# Patient Record
Sex: Female | Born: 1945
Health system: Southern US, Community
[De-identification: ages and names within clinical notes are randomized; demographics above are authoritative.]

## PROBLEM LIST (undated history)

## (undated) DIAGNOSIS — D4620 Refractory anemia with excess of blasts, unspecified: Secondary | ICD-10-CM

## (undated) DIAGNOSIS — Z860100 Personal history of colon polyps, unspecified: Secondary | ICD-10-CM

## (undated) DIAGNOSIS — R000 Tachycardia, unspecified: Secondary | ICD-10-CM

## (undated) DIAGNOSIS — I10 Essential (primary) hypertension: Secondary | ICD-10-CM

## (undated) DIAGNOSIS — T4145XA Adverse effect of unspecified anesthetic, initial encounter: Secondary | ICD-10-CM

## (undated) DIAGNOSIS — T8859XA Other complications of anesthesia, initial encounter: Secondary | ICD-10-CM

## (undated) DIAGNOSIS — K5790 Diverticulosis of intestine, part unspecified, without perforation or abscess without bleeding: Secondary | ICD-10-CM

## (undated) DIAGNOSIS — E785 Hyperlipidemia, unspecified: Secondary | ICD-10-CM

## (undated) DIAGNOSIS — E538 Deficiency of other specified B group vitamins: Secondary | ICD-10-CM

## (undated) HISTORY — PX: OTHER SURGICAL HISTORY: SHX169

## (undated) HISTORY — DX: Diverticulosis of intestine, part unspecified, without perforation or abscess without bleeding: K57.90

## (undated) HISTORY — DX: Tachycardia, unspecified: R00.0

## (undated) HISTORY — DX: Essential (primary) hypertension: I10

## (undated) HISTORY — DX: Hyperlipidemia, unspecified: E78.5

## (undated) HISTORY — DX: Personal history of colonic polyps: Z86.010

## (undated) HISTORY — DX: Deficiency of other specified B group vitamins: E53.8

## (undated) HISTORY — DX: Personal history of colon polyps, unspecified: Z86.0100

---
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
## 1998-04-24 HISTORY — PX: BREAST CYST ASPIRATION: SHX578

## 2004-04-24 LAB — HM DEXA SCAN

## 2009-03-17 LAB — FECAL OCCULT BLOOD, GUAIAC: Fecal Occult Blood: NEGATIVE

## 2009-04-24 LAB — HM PAP SMEAR

## 2009-10-05 LAB — HM MAMMOGRAPHY

## 2010-04-24 HISTORY — PX: OTHER SURGICAL HISTORY: SHX169

## 2010-07-13 ENCOUNTER — Encounter: Payer: Self-pay | Admitting: Family Medicine

## 2010-07-13 DIAGNOSIS — E785 Hyperlipidemia, unspecified: Secondary | ICD-10-CM | POA: Insufficient documentation

## 2010-07-13 DIAGNOSIS — I10 Essential (primary) hypertension: Secondary | ICD-10-CM

## 2010-07-13 DIAGNOSIS — K5790 Diverticulosis of intestine, part unspecified, without perforation or abscess without bleeding: Secondary | ICD-10-CM | POA: Insufficient documentation

## 2010-07-13 DIAGNOSIS — Z86010 Personal history of colonic polyps: Secondary | ICD-10-CM | POA: Insufficient documentation

## 2010-07-13 DIAGNOSIS — E538 Deficiency of other specified B group vitamins: Secondary | ICD-10-CM

## 2010-07-13 DIAGNOSIS — R000 Tachycardia, unspecified: Secondary | ICD-10-CM | POA: Insufficient documentation

## 2010-08-24 ENCOUNTER — Other Ambulatory Visit (HOSPITAL_COMMUNITY): Payer: Self-pay

## 2010-08-24 ENCOUNTER — Other Ambulatory Visit (HOSPITAL_COMMUNITY): Payer: Self-pay | Admitting: General Surgery

## 2010-08-24 DIAGNOSIS — X58XXXA Exposure to other specified factors, initial encounter: Secondary | ICD-10-CM

## 2012-07-30 ENCOUNTER — Other Ambulatory Visit: Payer: Self-pay | Admitting: Family Medicine

## 2012-08-21 ENCOUNTER — Other Ambulatory Visit: Payer: Self-pay | Admitting: Family Medicine

## 2012-09-03 ENCOUNTER — Other Ambulatory Visit: Payer: Self-pay | Admitting: Family Medicine

## 2012-09-04 NOTE — Telephone Encounter (Signed)
LAST OV 9/13 

## 2012-11-13 ENCOUNTER — Encounter: Payer: Self-pay | Admitting: Family Medicine

## 2012-11-13 ENCOUNTER — Telehealth: Payer: Self-pay | Admitting: Family Medicine

## 2012-11-13 ENCOUNTER — Ambulatory Visit (INDEPENDENT_AMBULATORY_CARE_PROVIDER_SITE_OTHER): Payer: BC Managed Care – PPO | Admitting: Family Medicine

## 2012-11-13 VITALS — BP 111/73 | HR 85 | Temp 98.3°F | Ht 66.0 in | Wt 130.2 lb

## 2012-11-13 DIAGNOSIS — R1031 Right lower quadrant pain: Secondary | ICD-10-CM

## 2012-11-13 LAB — POCT CBC
Lymph, poc: 1.7 (ref 0.6–3.4)
MCH, POC: 34.2 pg — AB (ref 27–31.2)
MCHC: 33.9 g/dL (ref 31.8–35.4)
MCV: 101.2 fL — AB (ref 80–97)
MPV: 9 fL (ref 0–99.8)
POC LYMPH PERCENT: 27.2 %L (ref 10–50)
Platelet Count, POC: 263 10*3/uL (ref 142–424)
RBC: 3.8 M/uL — AB (ref 4.04–5.48)
WBC: 6.4 10*3/uL (ref 4.6–10.2)

## 2012-11-13 NOTE — Patient Instructions (Signed)
Avoid any heavy lifting or straining If pain gets worse call back tonight or go to the emergency room We will call you in the morning once we are able to speak to the surgeon

## 2012-11-13 NOTE — Progress Notes (Signed)
  Subjective:    Patient ID: Emily Doyle, female    DOB: Dec 07, 1945, 67 y.o.   MRN: [id number]  HPI Patient was out of town today and noticed that she became flushed and uncomfortable and she felt as feeling in her right lower quadrant. She observed a fairly large bulge at the time. She did come on and since then has noticed that bulging has continued. There is no pain at present.   Review of Systems  Constitutional: Negative for fever.  HENT: Negative.   Respiratory: Negative.   Gastrointestinal: Positive for abdominal pain (lower abd lunch time today 1 episode ), blood in stool and abdominal distention (RLQ, firm bulge ). Negative for nausea, diarrhea and constipation.  Genitourinary: Negative for frequency, hematuria, vaginal bleeding, vaginal discharge and difficulty urinating.  Allergic/Immunologic: Positive for food allergies (MSG).  Neurological: Positive for weakness and light-headedness.       Objective:   Physical Exam  On examination there was a bulging and weakness in the right lower quadrant upon standing. On subsequent exams this bulging was less prominent. When she was supine there definitely is a weakness on the right and the musculature compared to the left. Was no tenderness to palpation.  Results for orders placed in visit on 11/13/12  POCT CBC      Result Value Range   WBC 6.4  4.6 - 10.2 K/uL   Lymph, poc 1.7  0.6 - 3.4   POC LYMPH PERCENT 27.2  10 - 50 %L   POC Granulocyte 4.3  2 - 6.9   Granulocyte percent 67.3  37 - 80 %G   RBC 3.8 (*) 4.04 - 5.48 M/uL   Hemoglobin 13.0  12.2 - 16.2 g/dL   HCT, POC 40.9  81.1 - 47.9 %   MCV 101.2 (*) 80 - 97 fL   MCH, POC 34.2 (*) 27 - 31.2 pg   MCHC 33.9  31.8 - 35.4 g/dL   RDW, POC 91.4     Platelet Count, POC 263.0  142 - 424 K/uL   MPV 9.0  0 - 99.8 fL         Assessment & Plan:  Probable right inguinal hernia -CBC -Discuss with surgeon in the morning  Patient Instructions  Avoid any heavy lifting or  straining If pain gets worse call back tonight or go to the emergency room We will call you in the morning once we are able to speak to the surgeon   Nyra Capes MD

## 2012-11-13 NOTE — Telephone Encounter (Signed)
appt given with dr. Christell Constant at 4:30

## 2012-11-14 ENCOUNTER — Encounter (INDEPENDENT_AMBULATORY_CARE_PROVIDER_SITE_OTHER): Payer: Self-pay | Admitting: Surgery

## 2012-11-14 ENCOUNTER — Other Ambulatory Visit (INDEPENDENT_AMBULATORY_CARE_PROVIDER_SITE_OTHER): Payer: Self-pay

## 2012-11-14 ENCOUNTER — Ambulatory Visit (INDEPENDENT_AMBULATORY_CARE_PROVIDER_SITE_OTHER): Payer: BC Managed Care – PPO | Admitting: Surgery

## 2012-11-14 VITALS — BP 122/80 | HR 80 | Temp 98.6°F | Resp 18 | Ht 67.0 in | Wt 131.0 lb

## 2012-11-14 DIAGNOSIS — K4190 Unilateral femoral hernia, without obstruction or gangrene, not specified as recurrent: Secondary | ICD-10-CM

## 2012-11-14 NOTE — Progress Notes (Signed)
Re:   Emily Doyle DOB:   07/22/1945 MRN:   [id number]  ASSESSMENT AND PLAN: 1.  Right femoral hernia  I discussed the indications and complications of hernia surgery with the patient.  I discussed both the laparoscopic and open approach to hernia repair..  The potential risks of hernia surgery include, but are not limited to, bleeding, infection, open surgery, nerve injury, and recurrence of the hernia.  I provided the patient literature about hernia surgery.  Because she of the location of the tumor, I think she would be better served with an open approach.  Plan:  She has a trip to [place].  I discussed the possibilities of incarceration and bowel obstruction, but I think she is very low risk for this and can wait till after her trip to schedule surgery.  She will call back after the trip to schedule the surgery.  2.  HTN - but thinks that she may not need meds any more 3.  History of tachycardia  Ablation 2011 in [facility]  Has done well since then. 4.  Hypercholesterolemia   Chief Complaint  Patient presents with  . New Evaluation    Hernia   REFERRING PHYSICIAN: Rudi Heap, MD  HISTORY OF PRESENT ILLNESS: Emily Doyle is a 67 y.o. (DOB: [birth year])  white  female whose primary care physician is Rudi Heap, MD and comes to me today for right inguinal hernia. She is by herself.  She has intentionally lost about 20 pounds of weight over the last 4 months.  She has had no prior abdominal problems.  Then yesterday, she noticed a bulge in her right groin..  She had some mild lower abdominal pain and felt "off" during the day.  She lay down and the right groin bulge decreased in size.  She saw Dr. Christell Constant who called me about seeing her.   Question prior abdominal surgery, she talked about exploration  For uterine fibroids in 1990, but nothing was done.  She has no history of stomach disease.  No history of liver disease.  No history of gall bladder disease.  No history of  pancreas disease.  No history of colon disease.  She had a colonoscopy in 2011 in [facility] which was negative.  Past Medical History  Diagnosis Date  . Other and unspecified hyperlipidemia   . HTN (hypertension), benign   . Tachycardia   . Diverticulosis   . History of colon polyps   . Migraine with aura   . Vitamin B 12 deficiency     Past Surgical History  Procedure Laterality Date  . Breast cyst aspiration  2000  . Atrial fibrillation ablation  2012     Current Outpatient Prescriptions  Medication Sig Dispense Refill  . aspirin 81 MG EC tablet Take 81 mg by mouth daily.       . Cholecalciferol (VITAMIN D3) 5000 UNITS TABS Take 5,000 Units by mouth 1 dose over 46 hours.        . cyanocobalamin (,VITAMIN B-12,) 1000 MCG/ML injection INJECT 1 ML (1000 MG) ONCE A MONTH AS DIRECTED  3 mL  2  . lisinopril (PRINIVIL,ZESTRIL) 10 MG tablet TAKE 1 TABLET DAILY  30 tablet  1  . montelukast (SINGULAIR) 10 MG tablet TAKE 1 TABLET DAILY  30 tablet  2  . pravastatin (PRAVACHOL) 40 MG tablet Take 40 mg by mouth daily. 1 & 1/2 daily       . Calcium Carbonate-Vit D-Min (CALCIUM 1200) 1200-1000 MG-UNIT  CHEW Chew 1,200 tablets by mouth 1 dose over 46 hours. Or 600IU twice daily        No current facility-administered medications for this visit.     No Known Allergies  REVIEW OF SYSTEMS: Skin:  No history of rash.  No history of abnormal moles. Infection:  No history of hepatitis or HIV.  No history of MRSA. Neurologic:  No history of stroke.  No history of seizure.  No history of headaches. Cardiac:   History of tachycardia.  She had an ablation in 2011 in [place].  Has done well since then and is not seeing a cardiologist. Pulmonary:  Does not smoke cigarettes.  No asthma or bronchitis.  No OSA/CPAP.  Endocrine:  No diabetes. No thyroid disease.  Hypercholesterolemia. Gastrointestinal:  See HPI. Urologic:  No history of kidney stones.  No history of bladder  infections. Musculoskeletal:  No history of joint or back disease. Hematologic:  No bleeding disorder.  No history of anemia.  Not anticoagulated. Psycho-social:  The patient is oriented.   The patient has no obvious psychologic or social impairment to understanding our conversation and plan.  SOCIAL and FAMILY HISTORY: Married. Going to [place] for 5 days next week.  The trip is with [facility] ([facility]).  PHYSICAL EXAM: BP 122/80  Pulse 80  Temp(Src) 98.6 F (37 C)  Resp 18  Ht 5\' 7"  (1.702 m)  Wt 131 lb (59.421 kg)  BMI 20.51 kg/m2  General: WN thin WF who is alert and generally healthy appearing.  HEENT: Normal. Pupils equal. Neck: Supple. No mass.  No thyroid mass. Lymph Nodes:  No supraclavicular or cervical nodes. Lungs: Clear to auscultation and symmetric breath sounds. Heart:  RRR. No murmur or rub. Abdomen: Soft. No mass. Normal bowel sounds.  She has a 1 x 3 cm mass in her right groin below the inguinal ligament.  This makes me think that she has a femoral hernia.  It is almost completely reduced and non tender. Rectal: Not done. Extremities:  Good strength and ROM  in upper and lower extremities. Neurologic:  Grossly intact to motor and sensory function. Psychiatric: Has normal mood and affect. Behavior is normal.   DATA REVIEWED: Notes in Epic  Ovidio Kin, MD,  [facility], [place] [address]  [address]   [place], [place]    [address] Phone:  [phone] FAX:  [phone]

## 2012-11-15 DIAGNOSIS — K4190 Unilateral femoral hernia, without obstruction or gangrene, not specified as recurrent: Secondary | ICD-10-CM | POA: Insufficient documentation

## 2012-11-21 ENCOUNTER — Other Ambulatory Visit: Payer: Self-pay | Admitting: Family Medicine

## 2012-11-22 ENCOUNTER — Telehealth (INDEPENDENT_AMBULATORY_CARE_PROVIDER_SITE_OTHER): Payer: Self-pay | Admitting: Obstetrics and Gynecology

## 2012-11-22 NOTE — Telephone Encounter (Signed)
Pt wants her surgery date

## 2012-12-05 ENCOUNTER — Other Ambulatory Visit (INDEPENDENT_AMBULATORY_CARE_PROVIDER_SITE_OTHER): Payer: Self-pay | Admitting: Surgery

## 2012-12-05 ENCOUNTER — Telehealth (INDEPENDENT_AMBULATORY_CARE_PROVIDER_SITE_OTHER): Payer: Self-pay | Admitting: *Deleted

## 2012-12-05 NOTE — Telephone Encounter (Signed)
Patient called to ask about getting her surgery scheduled.  Spoke to Dr. Ezzard Standing who went ahead and wrote orders for surgery at which this RN took to [facility] with surgery schedulers.  Florentina Addison states she will call the patient tomorrow to schedule.  Left voice message for patient to update her she will receive a call tomorrow to schedule her surgery but if she has any other questions then to give [place] a call.

## 2013-01-28 ENCOUNTER — Encounter (HOSPITAL_COMMUNITY): Payer: Self-pay | Admitting: Pharmacy Technician

## 2013-01-29 ENCOUNTER — Encounter (HOSPITAL_COMMUNITY): Payer: Self-pay

## 2013-01-29 ENCOUNTER — Encounter (HOSPITAL_COMMUNITY)
Admission: RE | Admit: 2013-01-29 | Discharge: 2013-01-29 | Disposition: A | Discharge status: Home / Self Care | Payer: BC Managed Care – PPO | Source: Ambulatory Visit | Attending: Surgery | Admitting: Surgery

## 2013-01-29 ENCOUNTER — Ambulatory Visit (HOSPITAL_COMMUNITY)
Admission: RE | Admit: 2013-01-29 | Discharge: 2013-01-29 | Disposition: A | Discharge status: Home / Self Care | Payer: BC Managed Care – PPO | Source: Ambulatory Visit | Attending: Surgery | Admitting: Surgery

## 2013-01-29 ENCOUNTER — Other Ambulatory Visit (HOSPITAL_COMMUNITY): Payer: Self-pay | Admitting: Surgery

## 2013-01-29 DIAGNOSIS — K4090 Unilateral inguinal hernia, without obstruction or gangrene, not specified as recurrent: Secondary | ICD-10-CM | POA: Insufficient documentation

## 2013-01-29 DIAGNOSIS — Z01818 Encounter for other preprocedural examination: Secondary | ICD-10-CM | POA: Insufficient documentation

## 2013-01-29 DIAGNOSIS — Z01812 Encounter for preprocedural laboratory examination: Secondary | ICD-10-CM | POA: Insufficient documentation

## 2013-01-29 DIAGNOSIS — Z01810 Encounter for preprocedural cardiovascular examination: Secondary | ICD-10-CM | POA: Insufficient documentation

## 2013-01-29 HISTORY — DX: Adverse effect of unspecified anesthetic, initial encounter: T41.45XA

## 2013-01-29 HISTORY — DX: Other complications of anesthesia, initial encounter: T88.59XA

## 2013-01-29 LAB — CBC
HCT: 35 % — ABNORMAL LOW (ref 36.0–46.0)
Hemoglobin: 12.1 g/dL (ref 12.0–15.0)
MCH: 34.7 pg — ABNORMAL HIGH (ref 26.0–34.0)
MCV: 100.3 fL — ABNORMAL HIGH (ref 78.0–100.0)
Platelets: 305 10*3/uL (ref 150–400)
RDW: 12.9 % (ref 11.5–15.5)

## 2013-01-29 LAB — BASIC METABOLIC PANEL
CO2: 27 mEq/L (ref 19–32)
Calcium: 9.5 mg/dL (ref 8.4–10.5)
Creatinine, Ser: 0.53 mg/dL (ref 0.50–1.10)
GFR calc non Af Amer: >90 mL/min (ref >90)
Glucose, Bld: 90 mg/dL (ref 70–99)
Sodium: 136 mEq/L (ref 135–145)

## 2013-01-29 NOTE — Patient Instructions (Addendum)
20 Emily Doyle  01/29/2013   Your procedure is scheduled on: 02-05-2013  Report to [facility] at 630 AM.  Call this number if you have problems the morning of surgery [phone]   Remember:   Do not eat food or drink liquids :After Midnight.     Take these medicines the morning of surgery with A SIP OF WATER: singulair                                SEE [facility] PREPARING FOR SURGERY SHEET             You may not have any metal on your body including hair pins and piercings  Do not wear jewelry, make-up.  Do not wear lotions, powders, or perfumes. You may wear deodorant.   Men may shave face and neck.  Do not bring valuables to the hospital. [facility] IS NOT RESPONSIBLE FOR VALUEABLES.  Contacts, dentures or bridgework may not be worn into surgery.  Leave suitcase in the car. After surgery it may be brought to your room.  For patients admitted to the hospital, checkout time is 11:00 AM the day of discharge.   Patients discharged the day of surgery will not be allowed to drive home.  Name and phone number of your driver: mack spouse no cell number will be here  Special Instructions: N/A   Please read over the following fact sheets that you were given:   Call Cain Sieve RN pre op nurse if needed [phone]    FAILURE TO FOLLOW THESE INSTRUCTIONS MAY RESULT IN THE CANCELLATION OF YOUR SURGERY.  PATIENT SIGNATURE___________________________________________  NURSE SIGNATURE_____________________________________________

## 2013-02-05 ENCOUNTER — Ambulatory Visit (HOSPITAL_COMMUNITY): Payer: BC Managed Care – PPO | Admitting: Anesthesiology

## 2013-02-05 ENCOUNTER — Ambulatory Visit (HOSPITAL_COMMUNITY)
Admission: RE | Admit: 2013-02-05 | Discharge: 2013-02-05 | Disposition: A | Discharge status: Home / Self Care | Payer: BC Managed Care – PPO | Source: Ambulatory Visit | Attending: Surgery | Admitting: Surgery

## 2013-02-05 ENCOUNTER — Encounter (HOSPITAL_COMMUNITY): Payer: Self-pay | Admitting: *Deleted

## 2013-02-05 ENCOUNTER — Encounter (HOSPITAL_COMMUNITY): Payer: BC Managed Care – PPO | Admitting: Anesthesiology

## 2013-02-05 ENCOUNTER — Encounter (HOSPITAL_COMMUNITY)
Admission: RE | Disposition: A | Discharge status: Home / Self Care | Payer: Self-pay | Source: Ambulatory Visit | Attending: Surgery

## 2013-02-05 DIAGNOSIS — K4190 Unilateral femoral hernia, without obstruction or gangrene, not specified as recurrent: Secondary | ICD-10-CM | POA: Insufficient documentation

## 2013-02-05 DIAGNOSIS — E538 Deficiency of other specified B group vitamins: Secondary | ICD-10-CM | POA: Insufficient documentation

## 2013-02-05 DIAGNOSIS — I10 Essential (primary) hypertension: Secondary | ICD-10-CM | POA: Insufficient documentation

## 2013-02-05 DIAGNOSIS — E7800 Pure hypercholesterolemia, unspecified: Secondary | ICD-10-CM | POA: Insufficient documentation

## 2013-02-05 DIAGNOSIS — Z79899 Other long term (current) drug therapy: Secondary | ICD-10-CM | POA: Insufficient documentation

## 2013-02-05 HISTORY — PX: INGUINAL HERNIA REPAIR: SHX194

## 2013-02-05 SURGERY — REPAIR, HERNIA, INGUINAL, ADULT
Anesthesia: General | Site: Abdomen | Laterality: Right | Wound class: Clean

## 2013-02-05 MED ORDER — CEFAZOLIN SODIUM-DEXTROSE 2-3 GM-% IV SOLR
2.0000 g | INTRAVENOUS | Status: AC
  Administered 2013-02-05: 2 g via INTRAVENOUS

## 2013-02-05 MED ORDER — CEFAZOLIN SODIUM-DEXTROSE 2-3 GM-% IV SOLR
INTRAVENOUS | Status: AC
  Filled 2013-02-05: qty 50

## 2013-02-05 MED ORDER — DEXAMETHASONE SODIUM PHOSPHATE 10 MG/ML IJ SOLN
INTRAMUSCULAR | Status: DC | PRN
  Administered 2013-02-05: 10 mg via INTRAVENOUS

## 2013-02-05 MED ORDER — BUPIVACAINE LIPOSOME 1.3 % IJ SUSP
INTRAMUSCULAR | Status: DC | PRN
  Administered 2013-02-05: 20 mL

## 2013-02-05 MED ORDER — HYDROCODONE-ACETAMINOPHEN 5-325 MG PO TABS: 1.0000 | ORAL_TABLET | Freq: Four times a day (QID) | ORAL | Status: DC | PRN

## 2013-02-05 MED ORDER — MIDAZOLAM HCL 5 MG/5ML IJ SOLN
INTRAMUSCULAR | Status: DC | PRN
  Administered 2013-02-05: 2 mg via INTRAVENOUS

## 2013-02-05 MED ORDER — BUPIVACAINE HCL (PF) 0.25 % IJ SOLN
INTRAMUSCULAR | Status: AC
  Filled 2013-02-05: qty 30

## 2013-02-05 MED ORDER — PROPOFOL 10 MG/ML IV BOLUS
INTRAVENOUS | Status: DC | PRN
  Administered 2013-02-05: 200 mg via INTRAVENOUS

## 2013-02-05 MED ORDER — LIDOCAINE HCL (CARDIAC) 20 MG/ML IV SOLN
INTRAVENOUS | Status: DC | PRN
  Administered 2013-02-05: 100 mg via INTRAVENOUS

## 2013-02-05 MED ORDER — LACTATED RINGERS IV SOLN
INTRAVENOUS | Status: DC | PRN
  Administered 2013-02-05 (×2): via INTRAVENOUS

## 2013-02-05 MED ORDER — FENTANYL CITRATE 0.05 MG/ML IJ SOLN
INTRAMUSCULAR | Status: DC | PRN
  Administered 2013-02-05 (×3): 50 ug via INTRAVENOUS

## 2013-02-05 MED ORDER — KETOROLAC TROMETHAMINE 30 MG/ML IJ SOLN: 15.0000 mg | Freq: Once | INTRAMUSCULAR | Status: DC | PRN

## 2013-02-05 MED ORDER — FENTANYL CITRATE 0.05 MG/ML IJ SOLN: 25.0000 ug | INTRAMUSCULAR | Status: DC | PRN

## 2013-02-05 MED ORDER — PHENYLEPHRINE HCL 10 MG/ML IJ SOLN
INTRAMUSCULAR | Status: DC | PRN
  Administered 2013-02-05: 80 ug via INTRAVENOUS

## 2013-02-05 MED ORDER — BUPIVACAINE LIPOSOME 1.3 % IJ SUSP
20.0000 mL | Freq: Once | INTRAMUSCULAR | Status: DC
  Filled 2013-02-05: qty 20

## 2013-02-05 MED ORDER — PROMETHAZINE HCL 25 MG/ML IJ SOLN: 6.2500 mg | INTRAMUSCULAR | Status: DC | PRN

## 2013-02-05 MED ORDER — ONDANSETRON HCL 4 MG/2ML IJ SOLN
INTRAMUSCULAR | Status: DC | PRN
  Administered 2013-02-05: 4 mg via INTRAMUSCULAR

## 2013-02-05 SURGICAL SUPPLY — 41 items
BENZOIN TINCTURE PRP APPL 2/3 (GAUZE/BANDAGES/DRESSINGS) ×2 IMPLANT
BLADE HEX COATED 2.75 (ELECTRODE) ×2 IMPLANT
BLADE SURG 15 STRL LF DISP TIS (BLADE) IMPLANT
BLADE SURG 15 STRL SS (BLADE)
BLADE SURG SZ10 CARB STEEL (BLADE) ×4 IMPLANT
CANISTER SUCTION 2500CC (MISCELLANEOUS) ×2 IMPLANT
CLOTH BEACON ORANGE TIMEOUT ST (SAFETY) ×2 IMPLANT
DECANTER SPIKE VIAL GLASS SM (MISCELLANEOUS) IMPLANT
DERMABOND ADVANCED (GAUZE/BANDAGES/DRESSINGS) ×1
DERMABOND ADVANCED .7 DNX12 (GAUZE/BANDAGES/DRESSINGS) ×1 IMPLANT
DISSECTOR ROUND CHERRY 3/8 STR (MISCELLANEOUS) ×2 IMPLANT
DRAIN PENROSE 18X1/2 LTX STRL (DRAIN) ×2 IMPLANT
DRAPE LAPAROTOMY TRNSV 102X78 (DRAPE) ×2 IMPLANT
ELECT REM PT RETURN 9FT ADLT (ELECTROSURGICAL) ×2
ELECTRODE REM PT RTRN 9FT ADLT (ELECTROSURGICAL) ×1 IMPLANT
GLOVE BIOGEL PI IND STRL 7.0 (GLOVE) ×1 IMPLANT
GLOVE BIOGEL PI INDICATOR 7.0 (GLOVE) ×1
GLOVE SURG SIGNA 7.5 PF LTX (GLOVE) ×2 IMPLANT
GOWN PREVENTION PLUS LG XLONG (DISPOSABLE) ×2 IMPLANT
GOWN STRL REIN XL XLG (GOWN DISPOSABLE) ×4 IMPLANT
KIT BASIN OR (CUSTOM PROCEDURE TRAY) ×2 IMPLANT
MESH ULTRAPRO 3X6 7.6X15CM (Mesh General) ×2 IMPLANT
NEEDLE HYPO 25X1 1.5 SAFETY (NEEDLE) ×2 IMPLANT
NS IRRIG 1000ML POUR BTL (IV SOLUTION) ×2 IMPLANT
PACK BASIC VI WITH GOWN DISP (CUSTOM PROCEDURE TRAY) ×2 IMPLANT
PENCIL BUTTON HOLSTER BLD 10FT (ELECTRODE) ×2 IMPLANT
SPONGE GAUZE 4X4 12PLY (GAUZE/BANDAGES/DRESSINGS) ×2 IMPLANT
SPONGE LAP 18X18 X RAY DECT (DISPOSABLE) IMPLANT
SPONGE LAP 4X18 X RAY DECT (DISPOSABLE) ×4 IMPLANT
STRIP CLOSURE SKIN 1/2X4 (GAUZE/BANDAGES/DRESSINGS) ×2 IMPLANT
SUT CHROMIC 0 SH (SUTURE) IMPLANT
SUT MON AB 5-0 PS2 18 (SUTURE) ×2 IMPLANT
SUT NOVA 0 T19/GS 22DT (SUTURE) ×6 IMPLANT
SUT VIC AB 0 CT2 27 (SUTURE) ×2 IMPLANT
SUT VIC AB 2-0 SH 27 (SUTURE) ×1
SUT VIC AB 2-0 SH 27X BRD (SUTURE) ×1 IMPLANT
SUT VIC AB 3-0 SH 18 (SUTURE) ×2 IMPLANT
SYR BULB IRRIGATION 50ML (SYRINGE) ×2 IMPLANT
SYR CONTROL 10ML LL (SYRINGE) ×2 IMPLANT
TOWEL OR 17X26 10 PK STRL BLUE (TOWEL DISPOSABLE) ×2 IMPLANT
YANKAUER SUCT BULB TIP 10FT TU (MISCELLANEOUS) ×2 IMPLANT

## 2013-02-05 NOTE — Op Note (Signed)
02/05/2013  10:23 AM  PATIENT:  Emily Doyle, 67 y.o., female, MRN: [id number]  PREOP DIAGNOSIS:  right femorl hernia  POSTOP DIAGNOSIS:   Right femoral hernia  PROCEDURE:   Procedure(s): Open HERNIA REPAIR femoral ADULT  SURGEON:   Ovidio Kin, M.D.  ANESTHESIA:   general  Anesthesiologist: Eilene Ghazi, MD CRNA: Doran Clay, CRNA  General  EBL:  minimal  ml  LOCAL MEDICATIONS USED:   20 cc Exparel  SPECIMEN:   None  COUNTS CORRECT:  YES  INDICATIONS FOR PROCEDURE:  [name] is a 67 y.o. (DOB: May 04, 1945) white  female whose primary care physician is Rudi Heap, MD and comes for repair of a right femoral hernia.   The indications and risks of the hernia surgery were explained to the patient.  The risks include, but are not limited to, infection, bleeding, recurrence of the hernia, and nerve injury.  Operative Note: The patient was taken to room number 6 at [facility] OR.  He underwent a general anesthesia.    A time out was held and the surgical checklist run.  Her lower abdomen was shaved and then prepped with chloroprep.  A right inguinal incision was made through the subcutaneous fat to the external oblique fascia.  The external ring was opened.  The inguinal floor had some laxity, but the primary hernia was a femoral hernia I identified below the inguinal ligament..  The hernia was freed up from the surrounding tissues and pushed above the inguinal ligament.  The hernia consisted primarily of preperitoneal fat.  The inguinal floor and femoral canal was repaired with a 3 x 6 inch piece of Ultrapro mesh.  The mesh was cut to fit the inguinal floor.  The mesh was sewn in place with interrupted 0 Novafil suture.  I sewed the mesh medially to the pubic tubercle.  Inferiorly to Cooper's ligament, then transitioned to the inguinal ligament.  Cranially the mesh was sewn to the transversalis muscle.  The mesh lay flat.  The inguinal floor and right femoral canal was  covered.  The external oblique was closed with a 3-0 vicryl.  The fascia and subcutaneous tissues were infiltrated with 20 cc of Exparel.  The skin was closed with 5-0 monocryl and painted with Dermabond. The sponge and needle count were correct at the end of the case.  The patient was transported to the recovery room in good condition.  The patient will go home today.  Discharge instructions reviewed.  Ovidio Kin, MD, [facility] Pager: [phone] Office phone:  [phone]

## 2013-02-05 NOTE — Transfer of Care (Signed)
Immediate Anesthesia Transfer of Care Note  Patient: Emily Doyle  Procedure(s) Performed: Procedure(s): HERNIA REPAIR femoral ADULT (Right)  Patient Location: PACU  Anesthesia Type:General  Level of Consciousness: sedated  Airway & Oxygen Therapy: Patient Spontanous Breathing and Patient connected to face mask oxygen  Post-op Assessment: Report given to PACU RN and Post -op Vital signs reviewed and stable  Post vital signs: Reviewed and stable  Complications: No apparent anesthesia complications

## 2013-02-05 NOTE — Anesthesia Postprocedure Evaluation (Signed)
  Anesthesia Post-op Note  Patient: Emily Doyle  Procedure(s) Performed: Procedure(s) (LRB): HERNIA REPAIR femoral ADULT (Right)  Patient Location: PACU  Anesthesia Type: General  Level of Consciousness: awake and alert   Airway and Oxygen Therapy: Patient Spontanous Breathing  Post-op Pain: mild  Post-op Assessment: Post-op Vital signs reviewed, Patient's Cardiovascular Status Stable, Respiratory Function Stable, Patent Airway and No signs of Nausea or vomiting  Last Vitals:  Filed Vitals:   02/05/13 1118  BP: 110/73  Pulse: 72  Temp: 36.6 C  Resp:     Post-op Vital Signs: stable   Complications: No apparent anesthesia complications

## 2013-02-05 NOTE — Progress Notes (Signed)
Pt ambulated to bathroom without complications.  Pt voided moderated amount of clear yellow urine.  Pt returned to Short Stay with LR from PACU.    Pt received LR while in short stay.

## 2013-02-05 NOTE — H&P (Signed)
Re: Emily Doyle  DOB: [birth year]  MRN: [id number]   ASSESSMENT AND PLAN:  1. Right femoral hernia   I discussed the indications and complications of hernia surgery with the patient. I discussed both the laparoscopic and open approach to hernia repair.. The potential risks of hernia surgery include, but are not limited to, bleeding, infection, open surgery, nerve injury, and recurrence of the hernia. I provided the patient literature about hernia surgery.   Because she of the location of the hernia, I think she would be better served with an open approach.   Plan: She had a good trip to [place].  To repair right femoral/inguinal hernia.  2. HTN - but thinks that she may not need meds any more  3. History of tachycardia  Ablation 2011 in [facility] Has done well since then.  4. Hypercholesterolemia   Chief Complaint   Patient presents with   .  New Evaluation     Hernia   REFERRING PHYSICIAN: Rudi Heap, MD   HISTORY OF PRESENT ILLNESS:  Emily Doyle is a 66 y.o. (DOB: 1945-11-19) white female whose primary care physician is Rudi Heap, MD and comes to me today for right inguinal hernia. She is by herself.  She has intentionally lost about 20 pounds of weight over the last 4 months. She has had no prior abdominal problems. Then yesterday, she noticed a bulge in her right groin.. She had some mild lower abdominal pain and felt "off" during the day. She lay down and the right groin bulge decreased in size. She saw Dr. Christell Constant who called me about seeing her.  Question prior abdominal surgery, she talked about exploration For uterine fibroids in 1990, but nothing was done. She has no history of stomach disease. No history of liver disease. No history of gall bladder disease. No history of pancreas disease. No history of colon disease. She had a colonoscopy in 2011 in [facility] which was negative.   Past Medical History   Diagnosis  Date   .  Other and unspecified hyperlipidemia     .  HTN (hypertension), benign    .  Tachycardia    .  Diverticulosis    .  History of colon polyps    .  Migraine with aura    .  Vitamin B 12 deficiency     Past Surgical History   Procedure  Laterality  Date   .  Breast cyst aspiration   2000   .  Atrial fibrillation ablation   2012    Current Outpatient Prescriptions   Medication  Sig  Dispense  Refill   .  aspirin 81 MG EC tablet  Take 81 mg by mouth daily.     .  Cholecalciferol (VITAMIN D3) 5000 UNITS TABS  Take 5,000 Units by mouth 1 dose over 46 hours.     .  cyanocobalamin (,VITAMIN B-12,) 1000 MCG/ML injection  INJECT 1 ML (1000 MG) ONCE A MONTH AS DIRECTED  3 mL  2   .  lisinopril (PRINIVIL,ZESTRIL) 10 MG tablet  TAKE 1 TABLET DAILY  30 tablet  1   .  montelukast (SINGULAIR) 10 MG tablet  TAKE 1 TABLET DAILY  30 tablet  2   .  pravastatin (PRAVACHOL) 40 MG tablet  Take 40 mg by mouth daily. 1 & 1/2 daily     .  Calcium Carbonate-Vit D-Min (CALCIUM 1200) 1200-1000 MG-UNIT CHEW  Chew 1,200 tablets by mouth 1 dose over 46 hours.  Or 600IU twice daily      No current facility-administered medications for this visit.   No Known Allergies   REVIEW OF SYSTEMS:  Skin: No history of rash. No history of abnormal moles.  Infection: No history of hepatitis or HIV. No history of MRSA.  Neurologic: No history of stroke. No history of seizure. No history of headaches.  Cardiac: History of tachycardia. She had an ablation in 2011 in [place]. Has done well since then and is not seeing a cardiologist.  Pulmonary: Does not smoke cigarettes. No asthma or bronchitis. No OSA/CPAP.  Endocrine: No diabetes. No thyroid disease. Hypercholesterolemia.  Gastrointestinal: See HPI.  Urologic: No history of kidney stones. No history of bladder infections.  Musculoskeletal: No history of joint or back disease.  Hematologic: No bleeding disorder. No history of anemia. Not anticoagulated.  Psycho-social: The patient is oriented. The patient has no  obvious psychologic or social impairment to understanding our conversation and plan.  SOCIAL and FAMILY HISTORY:  Married.  Going to [place] for 5 days next week. The trip is with [facility] ([facility]).   PHYSICAL EXAM:  BP 130/83  Pulse 74  Temp(Src) 97.6 F (36.4 C) (Oral)  Resp 18  SpO2 100%  Ht 5\' 7"  (1.702 m)  Wt 131 lb (59.421 kg)  BMI 20.51 kg/m2   General: WN thin WF who is alert and generally healthy appearing.  HEENT: Normal. Pupils equal.  Neck: Supple. No mass. No thyroid mass.  Lymph Nodes: No supraclavicular or cervical nodes.  Lungs: Clear to auscultation and symmetric breath sounds.  Heart: RRR. No murmur or rub.  Abdomen: Soft. No mass. Normal bowel sounds. She has a 1 x 3 cm mass in her right groin below the inguinal ligament. This makes me think that she has a femoral hernia. It is almost completely reduced and non tender.  Rectal: Not done.  Extremities: Good strength and ROM in upper and lower extremities.  Neurologic: Grossly intact to motor and sensory function.  Psychiatric: Has normal mood and affect. Behavior is normal.   DATA REVIEWED:  Notes in Epic   Ovidio Kin, MD, [facility], [place]  [address] [place]  [place], [place] [address]  Phone: [phone] FAX: [phone]

## 2013-02-05 NOTE — Anesthesia Preprocedure Evaluation (Signed)
Anesthesia Evaluation  Patient identified by MRN, date of birth, ID band Patient awake    Reviewed: Allergy & Precautions, H&P , NPO status , Patient's Chart, lab work & pertinent test results  Airway Mallampati: II TM Distance: >3 FB Neck ROM: Full    Dental no notable dental hx.    Pulmonary neg pulmonary ROS,  breath sounds clear to auscultation  Pulmonary exam normal       Cardiovascular hypertension, Pt. on medications Rhythm:Regular Rate:Normal     Neuro/Psych negative neurological ROS  negative psych ROS   GI/Hepatic negative GI ROS, Neg liver ROS,   Endo/Other  negative endocrine ROS  Renal/GU negative Renal ROS  negative genitourinary   Musculoskeletal negative musculoskeletal ROS (+)   Abdominal   Peds negative pediatric ROS (+)  Hematology negative hematology ROS (+)   Anesthesia Other Findings   Reproductive/Obstetrics negative OB ROS                           Anesthesia Physical Anesthesia Plan  ASA: II  Anesthesia Plan: General   Post-op Pain Management:    Induction: Intravenous  Airway Management Planned: Oral ETT and LMA  Additional Equipment:   Intra-op Plan:   Post-operative Plan: Extubation in OR  Informed Consent: I have reviewed the patients History and Physical, chart, labs and discussed the procedure including the risks, benefits and alternatives for the proposed anesthesia with the patient or authorized representative who has indicated his/her understanding and acceptance.   Dental advisory given  Plan Discussed with: CRNA and Surgeon  Anesthesia Plan Comments:         Anesthesia Quick Evaluation

## 2013-02-06 ENCOUNTER — Encounter (HOSPITAL_COMMUNITY): Payer: Self-pay | Admitting: Surgery

## 2013-02-20 ENCOUNTER — Ambulatory Visit (INDEPENDENT_AMBULATORY_CARE_PROVIDER_SITE_OTHER): Payer: BC Managed Care – PPO | Admitting: Surgery

## 2013-02-20 ENCOUNTER — Encounter (INDEPENDENT_AMBULATORY_CARE_PROVIDER_SITE_OTHER): Payer: Self-pay | Admitting: Surgery

## 2013-02-20 VITALS — BP 126/74 | HR 62 | Temp 98.6°F | Resp 16 | Ht 67.0 in | Wt 132.0 lb

## 2013-02-20 DIAGNOSIS — K4190 Unilateral femoral hernia, without obstruction or gangrene, not specified as recurrent: Secondary | ICD-10-CM

## 2013-02-20 NOTE — Progress Notes (Signed)
Re:   Emily Doyle DOB:   Jun 30, 1945 MRN:   [id number]  ASSESSMENT AND PLAN: 1.  Right femoral hernia  Repaired - 02/05/2013 - D. Aleya Durnell  Has done well.    Her return appt is PRN. 2.  HTN - but thinks that she may not need meds any more 3.  History of tachycardia  Ablation 2011 in [facility].  Has done well since then. 4.  Hypercholesterolemia   Chief Complaint  Patient presents with  . Follow-up   REFERRING PHYSICIAN: Rudi Heap, MD  HISTORY OF PRESENT ILLNESS: MARCIE Doyle is a 67 y.o. (DOB: Jul 03, 1945)  white  female whose primary care physician is Rudi Heap, MD and comes to me today for follow up of a right femoral hernia repair. She is by herself. The Exparel worked well for her, in that she took one pain med. She has otherwise done very well.  Past Medical History  Diagnosis Date  . Other and unspecified hyperlipidemia   . HTN (hypertension), benign   . Tachycardia   . Diverticulosis   . History of colon polyps   . Migraine with aura   . Vitamin B 12 deficiency   . Complication of anesthesia     slow to wake up     Current Outpatient Prescriptions  Medication Sig Dispense Refill  . aspirin 81 MG EC tablet Take 81 mg by mouth daily.       . Cholecalciferol (VITAMIN D3) 5000 UNITS TABS Take 5,000 Units by mouth daily.       . Cyanocobalamin (VITAMIN B-12 IJ) Inject as directed every 30 (thirty) days. Around the 1st of the month      . diphenhydrAMINE (BENADRYL) 25 MG tablet Take 25 mg by mouth every 6 (six) hours as needed for itching. Takes 1/2 to 1 tab prn hs      . HYDROcodone-acetaminophen (NORCO/VICODIN) 5-325 MG per tablet Take 1-2 tablets by mouth every 6 (six) hours as needed for pain.  30 tablet  0  . Ibuprofen-Diphenhydramine Cit (ADVIL PM PO) Take 1 tablet by mouth at bedtime.      Marland Kitchen lisinopril (PRINIVIL,ZESTRIL) 10 MG tablet Take 10 mg by mouth every morning.      . montelukast (SINGULAIR) 10 MG tablet Take 10 mg by mouth every morning.       . Multiple Vitamin (MULTIVITAMIN WITH MINERALS) TABS tablet Take 1 tablet by mouth daily.      . pravastatin (PRAVACHOL) 40 MG tablet Take 40 mg by mouth every evening.       No current facility-administered medications for this visit.      Allergies  Allergen Reactions  . Other Other (See Comments)    Msg= cramps and diarrhea    REVIEW OF SYSTEMS: Cardiac:   History of tachycardia.  She had an ablation in 2011 in [place].  Has done well since then and is not seeing a cardiologist. Endocrine:  Hypercholesterolemia. Gastrointestinal:  See HPI.  SOCIAL and FAMILY HISTORY: Married. Going to [place] for 5 days next week.  The trip is with [facility] ([facility]. [place] [facility]).  PHYSICAL EXAM: BP 126/74  Pulse 62  Temp(Src) 98.6 F (37 C) (Temporal)  Resp 16  Ht 5\' 7"  (1.702 m)  Wt 132 lb (59.875 kg)  BMI 20.67 kg/m2  General: WN thin WF who is alert and generally healthy appearing.  Abdomen: Right groin incision - okay.  DATA REVIEWED: Notes in Epic  Ovidio Kin, MD,  [facility], [place]  [place], [place]    [place] Phone:  [place]:  [phone]

## 2013-03-06 ENCOUNTER — Encounter (HOSPITAL_COMMUNITY): Payer: Self-pay | Admitting: Surgery

## 2013-03-08 ENCOUNTER — Other Ambulatory Visit: Payer: Self-pay | Admitting: Family Medicine

## 2013-03-11 NOTE — Telephone Encounter (Signed)
Patient needs to come in for liver function tests and cholesterol panel The prescription will be okay but she does need to come in and get this lab work

## 2013-03-11 NOTE — Telephone Encounter (Signed)
No OV or labs since 09/13

## 2013-06-23 ENCOUNTER — Other Ambulatory Visit: Payer: Self-pay | Admitting: Family Medicine

## 2013-06-24 ENCOUNTER — Other Ambulatory Visit: Payer: Self-pay | Admitting: Family Medicine

## 2013-06-25 NOTE — Telephone Encounter (Signed)
Last seen 11/13/12  DWM   No lipids since EPIC

## 2013-06-26 NOTE — Telephone Encounter (Signed)
Last seen 11/14/12  DWM

## 2013-08-14 ENCOUNTER — Other Ambulatory Visit: Payer: Self-pay | Admitting: Family Medicine

## 2013-08-14 NOTE — Telephone Encounter (Signed)
Last seen 11/13/12  DWM

## 2013-09-10 ENCOUNTER — Ambulatory Visit (INDEPENDENT_AMBULATORY_CARE_PROVIDER_SITE_OTHER): Payer: BC Managed Care – PPO | Admitting: Physician Assistant

## 2013-09-10 ENCOUNTER — Encounter: Payer: Self-pay | Admitting: Physician Assistant

## 2013-09-10 VITALS — BP 117/73 | HR 82 | Temp 98.0°F | Ht 67.0 in | Wt 137.0 lb

## 2013-09-10 DIAGNOSIS — J019 Acute sinusitis, unspecified: Secondary | ICD-10-CM

## 2013-09-10 MED ORDER — AMOXICILLIN-POT CLAVULANATE 875-125 MG PO TABS: 1.0000 | ORAL_TABLET | Freq: Two times a day (BID) | ORAL | Status: DC

## 2013-09-10 NOTE — Patient Instructions (Signed)

## 2013-09-10 NOTE — Progress Notes (Signed)
Subjective:     Patient ID: Emily Doyle, female   DOB: August 08, 1945, 68 y.o.   MRN: [id number]  HPI Pt with dry hacky cough for 1 weeks Has used OTC cold  meds but continues with sx  Review of Systems  HENT: Positive for congestion, postnasal drip, rhinorrhea, sinus pressure, sore throat and voice change. Negative for sneezing.   Respiratory: Positive for cough. Negative for apnea, chest tightness and wheezing.        Objective:   Physical Exam Ears- canals/TM's nl Oral- + PND, no increase in tonsil size No cerv nodes Heart- RRR w/o M Lungs- CTA Sinus- + TTP frontal    Assessment:     Sinusitis    Plan:     OTC anithist Fluids Rest Augmentin rx 1 po bid # 20 F/U prn

## 2013-10-01 ENCOUNTER — Other Ambulatory Visit: Payer: Self-pay | Admitting: Family Medicine

## 2013-10-02 NOTE — Telephone Encounter (Signed)
Last seen 09/10/13 WLW  No lipids since EPIC

## 2014-01-05 ENCOUNTER — Other Ambulatory Visit: Payer: Self-pay | Admitting: Family Medicine

## 2014-01-06 NOTE — Telephone Encounter (Signed)
No B12 labs in Epic. No injections documented in Epic. Last refill 06/18/13. ntbs

## 2014-01-13 ENCOUNTER — Other Ambulatory Visit: Payer: Self-pay | Admitting: Family Medicine

## 2014-01-14 NOTE — Telephone Encounter (Signed)
No labs or visit since 01/15/12

## 2014-01-14 NOTE — Telephone Encounter (Signed)
I will okay the refill. Please call and tell the patient she needs to come in and get some routine lab work to monitor her cholesterol and her liver function tests.

## 2014-01-15 ENCOUNTER — Telehealth: Payer: Self-pay

## 2014-02-11 ENCOUNTER — Ambulatory Visit (INDEPENDENT_AMBULATORY_CARE_PROVIDER_SITE_OTHER): Payer: BC Managed Care – PPO | Admitting: Family Medicine

## 2014-02-11 ENCOUNTER — Encounter: Payer: Self-pay | Admitting: Family Medicine

## 2014-02-11 ENCOUNTER — Other Ambulatory Visit: Payer: Self-pay | Admitting: Family Medicine

## 2014-02-11 VITALS — BP 124/78 | HR 74 | Temp 97.6°F | Ht 67.0 in | Wt 130.0 lb

## 2014-02-11 DIAGNOSIS — E785 Hyperlipidemia, unspecified: Secondary | ICD-10-CM

## 2014-02-11 DIAGNOSIS — Z13820 Encounter for screening for osteoporosis: Secondary | ICD-10-CM

## 2014-02-11 DIAGNOSIS — Z780 Asymptomatic menopausal state: Secondary | ICD-10-CM

## 2014-02-11 DIAGNOSIS — Z825 Family history of asthma and other chronic lower respiratory diseases: Secondary | ICD-10-CM | POA: Insufficient documentation

## 2014-02-11 DIAGNOSIS — J301 Allergic rhinitis due to pollen: Secondary | ICD-10-CM

## 2014-02-11 DIAGNOSIS — E538 Deficiency of other specified B group vitamins: Secondary | ICD-10-CM

## 2014-02-11 DIAGNOSIS — I10 Essential (primary) hypertension: Secondary | ICD-10-CM

## 2014-02-11 DIAGNOSIS — Z800 Family history of malignant neoplasm of digestive organs: Secondary | ICD-10-CM | POA: Insufficient documentation

## 2014-02-11 DIAGNOSIS — Z23 Encounter for immunization: Secondary | ICD-10-CM

## 2014-02-11 DIAGNOSIS — Z0000 Encounter for general adult medical examination without abnormal findings: Secondary | ICD-10-CM

## 2014-02-11 LAB — POCT URINALYSIS DIPSTICK
Bilirubin, UA: NEGATIVE
Glucose, UA: NEGATIVE
Nitrite, UA: NEGATIVE
PH UA: 6
PROTEIN UA: NEGATIVE
SPEC GRAV UA: 1.015
UROBILINOGEN UA: NEGATIVE

## 2014-02-11 LAB — POCT UA - MICROSCOPIC ONLY
CASTS, UR, LPF, POC: NEGATIVE
CRYSTALS, UR, HPF, POC: NEGATIVE
Mucus, UA: NEGATIVE
YEAST UA: NEGATIVE

## 2014-02-11 LAB — POCT CBC
GRANULOCYTE PERCENT: 53.3 % (ref 37–80)
HEMATOCRIT: 33.8 % — AB (ref 37.7–47.9)
HEMOGLOBIN: 11.2 g/dL — AB (ref 12.2–16.2)
Lymph, poc: 1.5 (ref 0.6–3.4)
MCH, POC: 34.7 pg — AB (ref 27–31.2)
MCHC: 33.2 g/dL (ref 31.8–35.4)
MCV: 104.4 fL — AB (ref 80–97)
MPV: 9.3 fL (ref 0–99.8)
POC LYMPH PERCENT: 40.3 %L (ref 10–50)
Platelet Count, POC: 256 10*3/uL (ref 142–424)
RBC: 3.2 M/uL — AB (ref 4.04–5.48)
RDW, POC: 13.8 %
WBC: 3.8 10*3/uL — AB (ref 4.6–10.2)

## 2014-02-11 MED ORDER — LISINOPRIL 10 MG PO TABS: ORAL_TABLET | ORAL | Status: DC

## 2014-02-11 MED ORDER — CYANOCOBALAMIN 1000 MCG/ML IJ SOLN: INTRAMUSCULAR | Status: DC

## 2014-02-11 MED ORDER — PRAVASTATIN SODIUM 40 MG PO TABS: ORAL_TABLET | ORAL | Status: DC

## 2014-02-11 MED ORDER — MONTELUKAST SODIUM 10 MG PO TABS: ORAL_TABLET | ORAL | Status: DC

## 2014-02-11 NOTE — Addendum Note (Signed)
Addended by: Zannie Cove on: 02/11/2014 03:27 PM   Modules accepted: Orders

## 2014-02-11 NOTE — Patient Instructions (Addendum)
Continue current medications. Continue good therapeutic lifestyle changes which include good diet and exercise. Fall precautions discussed with patient. If an FOBT was given today- please return it to our front desk. If you are over 68 years old - you may need Prevnar 76 or the adult Pneumonia vaccine.  Flu Shots will be available at our office starting mid- September. Please call and schedule a FLU CLINIC APPOINTMENT.   Be sure and get shingles shot, Prevnar vaccine, and flu shot. Stay away from artificial sweeteners, Stevia and truvia should be safe Please signup for My Chart Continue to drink plenty of fluids Please check with the gastroenterologist as you may need a colonoscopy next spring

## 2014-02-11 NOTE — Progress Notes (Signed)
Subjective:    Patient ID: Emily Doyle, female    DOB: [birth year], 68 y.o.   MRN: [id number]  HPI Patient is here today for annual wellness exam and follow up of chronic medical problems.With health maintenance issues, she is due to get a Prevnar vaccine and a flu shot. The patient is also due to get a shingles shot. She will get the flu shot at work. She has no specific complaints today. She is no longer having any palpitation and no longer takes Lanoxin.        Patient Active Problem List   Diagnosis Date Noted  . Family history of colon cancer 02/11/2014  . Femoral hernia, right. 11/15/2012  . Vitamin B 12 deficiency 07/13/2010  . Hyperlipidemia   . HTN (hypertension), benign   . Tachycardia   . Diverticulosis   . Colon polyps    Outpatient Encounter Prescriptions as of 02/11/2014  Medication Sig  . aspirin 81 MG EC tablet Take 81 mg by mouth daily.   . Cholecalciferol (VITAMIN D3) 5000 UNITS TABS Take 5,000 Units by mouth daily.   . cyanocobalamin (,VITAMIN B-12,) 1000 MCG/ML injection INJECT 1 ML (1000MG) ONCE A MONTH AS DIRECTED  . Ibuprofen-Diphenhydramine Cit (ADVIL PM PO) Take 1 tablet by mouth at bedtime.  Marland Kitchen lisinopril (PRINIVIL,ZESTRIL) 10 MG tablet TAKE 1 TABLET DAILY  . montelukast (SINGULAIR) 10 MG tablet TAKE 1 TABLET DAILY  . Multiple Vitamin (MULTIVITAMIN WITH MINERALS) TABS tablet Take 1 tablet by mouth daily.  . pravastatin (PRAVACHOL) 40 MG tablet TAKE 1 & 1/2 TABLET AT BEDTIME  . [DISCONTINUED] amoxicillin-clavulanate (AUGMENTIN) 875-125 MG per tablet Take 1 tablet by mouth 2 (two) times daily.    Review of Systems  Constitutional: Negative.   HENT: Negative.   Eyes: Negative.   Respiratory: Negative.   Cardiovascular: Negative.   Gastrointestinal: Negative.   Endocrine: Negative.   Genitourinary: Negative.   Musculoskeletal: Negative.   Skin: Negative.   Allergic/Immunologic: Negative.   Neurological: Negative.   Hematological: Negative.    Psychiatric/Behavioral: Negative.        Objective:   Physical Exam  Nursing note and vitals reviewed. Constitutional: She is oriented to person, place, and time. She appears well-developed and well-nourished. No distress.  HENT:  Head: Normocephalic and atraumatic.  Right Ear: External ear normal.  Left Ear: External ear normal.  Mouth/Throat: Oropharynx is clear and moist.  Nasal congestion and turbinate swelling bilaterally  Eyes: Conjunctivae and EOM are normal. Pupils are equal, round, and reactive to light. Right eye exhibits no discharge. Left eye exhibits no discharge. No scleral icterus.  Neck: Normal range of motion. Neck supple. No thyromegaly present.  No carotid bruits  Cardiovascular: Normal rate, regular rhythm, normal heart sounds and intact distal pulses.  Exam reveals no gallop and no friction rub.   No murmur heard. At 72 per minute without murmur  Pulmonary/Chest: Effort normal and breath sounds normal. No respiratory distress. She has no wheezes. She has no rales. She exhibits no tenderness.  No axillary adenopathy  Abdominal: Soft. Bowel sounds are normal. She exhibits no mass. There is no tenderness. There is no rebound and no guarding.  No inguinal adenopathy  Musculoskeletal: Normal range of motion. She exhibits no edema and no tenderness.  Lymphadenopathy:    She has no cervical adenopathy.  Neurological: She is alert and oriented to person, place, and time. She has normal reflexes. No cranial nerve deficit.  Skin: Skin is warm and dry. No rash  noted. No erythema. No pallor.  Dry skin  Psychiatric: She has a normal mood and affect. Her behavior is normal. Judgment and thought content normal.   BP 124/78  Pulse 74  Temp(Src) 97.6 F (36.4 C) (Oral)  Ht _0  (1.702 m)  Wt 130 lb (58.968 kg)  BMI 20.36 kg/m2  EKG:  normal sinus rhythm       Assessment & Plan:   1. Vitamin B 12 deficiency - POCT CBC - Vitamin B12  2. Hyperlipidemia - POCT  CBC - NMR, lipoprofile - Hepatitis C Antibody  3. HTN (hypertension), benign - POCT CBC - BMP8+EGFR - Hepatic function panel  4. Annual physical exam - DG Bone Density; Future - POCT CBC - BMP8+EGFR - Hepatic function panel - NMR, lipoprofile - Thyroid Panel With TSH - Vit D  25 hydroxy (rtn osteoporosis monitoring) - Vitamin B12 - Urine culture - POCT urinalysis dipstick - POCT UA - Microscopic Only - Hepatitis C Antibody - High sensitivity CRP - Sedimentation rate - EKG 12-Lead  5. Postmenopausal - DG Bone Density; Future - POCT CBC  6. Screening for osteoporosis - DG Bone Density; Future - POCT CBC  7. Allergic rhinitis due to pollen  8. Family history of COPD (chronic obstructive pulmonary disease)   Meds ordered this encounter  Medications  . pravastatin (PRAVACHOL) 40 MG tablet    Sig: TAKE 1 & 1/2 TABLET AT BEDTIME    Dispense:  135 tablet    Refill:  11  . lisinopril (PRINIVIL,ZESTRIL) 10 MG tablet    Sig: TAKE 1 TABLET DAILY    Dispense:  30 tablet    Refill:  11  . cyanocobalamin (,VITAMIN B-12,) 1000 MCG/ML injection    Sig: INJECT 1 ML (1000MG) ONCE A MONTH AS DIRECTED    Dispense:  3 mL    Refill:  3    ntbs for further refills  . montelukast (SINGULAIR) 10 MG tablet    Sig: TAKE 1 TABLET DAILY    Dispense:  30 tablet    Refill:  11   Patient Instructions  Continue current medications. Continue good therapeutic lifestyle changes which include good diet and exercise. Fall precautions discussed with patient. If an FOBT was given today- please return it to our front desk. If you are over 29 years old - you may need Prevnar 24 or the adult Pneumonia vaccine.  Flu Shots will be available at our office starting mid- September. Please call and schedule a FLU CLINIC APPOINTMENT.   Be sure and get shingles shot, Prevnar vaccine, and flu shot. Stay away from artificial sweeteners, Stevia and truvia should be safe Please signup for My  Chart Continue to drink plenty of fluids Please check with the gastroenterologist as you may need a colonoscopy next spring   Arrie Senate MD

## 2014-02-12 ENCOUNTER — Other Ambulatory Visit: Payer: BC Managed Care – PPO

## 2014-02-12 DIAGNOSIS — Z1212 Encounter for screening for malignant neoplasm of rectum: Secondary | ICD-10-CM

## 2014-02-12 LAB — SEDIMENTATION RATE: Sed Rate: 3 mm/hr (ref 0–40)

## 2014-02-12 NOTE — Addendum Note (Signed)
Addended by: Wyline Mood on: 02/12/2014 05:25 PM   Modules accepted: Orders

## 2014-02-13 LAB — URINE CULTURE

## 2014-02-14 LAB — FECAL OCCULT BLOOD, IMMUNOCHEMICAL: Fecal Occult Bld: NEGATIVE

## 2014-02-16 LAB — THYROID PANEL WITH TSH
FREE THYROXINE INDEX: 2.6 (ref 1.2–4.9)
T3 Uptake Ratio: 34 % (ref 24–39)
T4, Total: 7.5 ug/dL (ref 4.5–12.0)
TSH: 0.659 u[IU]/mL (ref 0.450–4.500)

## 2014-02-16 LAB — HEPATIC FUNCTION PANEL
ALT: 13 IU/L (ref 0–32)
AST: 20 IU/L (ref 0–40)
Albumin: 4.7 g/dL (ref 3.6–4.8)
Alkaline Phosphatase: 76 IU/L (ref 39–117)
Bilirubin, Direct: 0.14 mg/dL (ref 0.00–0.40)
Total Bilirubin: 0.5 mg/dL (ref 0.0–1.2)
Total Protein: 6.9 g/dL (ref 6.0–8.5)

## 2014-02-16 LAB — NMR, LIPOPROFILE
Cholesterol: 198 mg/dL (ref 100–199)
HDL Cholesterol by NMR: 117 mg/dL (ref >39)
HDL Particle Number: 47.3 umol/L (ref >=30.5)
LDL Particle Number: 781 nmol/L (ref <1000)
LDL Size: 21.2 nm (ref >20.5)
LDLC SERPL CALC-MCNC: 67 mg/dL (ref 0–99)
Small LDL Particle Number: <90 nmol/L (ref <=527)
Triglycerides by NMR: 70 mg/dL (ref 0–149)

## 2014-02-16 LAB — BMP8+EGFR
BUN/Creatinine Ratio: 24 (ref 11–26)
BUN: 14 mg/dL (ref 8–27)
CHLORIDE: 100 mmol/L (ref 97–108)
CO2: 22 mmol/L (ref 18–29)
Calcium: 9.4 mg/dL (ref 8.7–10.3)
Creatinine, Ser: 0.58 mg/dL (ref 0.57–1.00)
GFR calc Af Amer: 110 mL/min/{1.73_m2} (ref >59)
GFR, EST NON AFRICAN AMERICAN: 95 mL/min/{1.73_m2} (ref >59)
GLUCOSE: 90 mg/dL (ref 65–99)
Potassium: 3.9 mmol/L (ref 3.5–5.2)
Sodium: 140 mmol/L (ref 134–144)

## 2014-02-16 LAB — HEPATITIS C ANTIBODY: Hep C Virus Ab: <0.1 s/co ratio (ref 0.0–0.9)

## 2014-02-16 LAB — HIGH SENSITIVITY CRP: CRP, High Sensitivity: 0.48 mg/L (ref 0.00–3.00)

## 2014-02-16 LAB — VITAMIN B12: Vitamin B-12: 241 pg/mL (ref 211–946)

## 2014-02-16 LAB — VITAMIN D 25 HYDROXY (VIT D DEFICIENCY, FRACTURES): VIT D 25 HYDROXY: 67.9 ng/mL (ref 30.0–100.0)

## 2014-02-17 ENCOUNTER — Telehealth: Payer: Self-pay | Admitting: Family Medicine

## 2014-02-17 ENCOUNTER — Telehealth: Payer: Self-pay | Admitting: *Deleted

## 2014-02-17 NOTE — Telephone Encounter (Signed)
Patient aware.

## 2014-02-17 NOTE — Telephone Encounter (Signed)
Aware. 

## 2014-03-10 ENCOUNTER — Telehealth: Payer: Self-pay | Admitting: *Deleted

## 2014-03-10 NOTE — Telephone Encounter (Signed)
Patient aware of normal Alpha-1 Antitrypsin result.

## 2014-03-12 ENCOUNTER — Telehealth: Payer: Self-pay | Admitting: Family Medicine

## 2014-03-13 MED ORDER — CYANOCOBALAMIN 1000 MCG/ML IJ SOLN: INTRAMUSCULAR | Status: DC

## 2014-03-13 NOTE — Telephone Encounter (Signed)
Order for vitamin b12 sent to pharmacy and order for nurse at [facility] sent to Ananias Pilgrim for her to administer Vitamin b12 injections once weekly for a month and then to resume monthly.

## 2014-04-09 NOTE — Telephone Encounter (Signed)
Close encounter 

## 2014-11-13 ENCOUNTER — Other Ambulatory Visit (INDEPENDENT_AMBULATORY_CARE_PROVIDER_SITE_OTHER): Payer: BLUE CROSS/BLUE SHIELD

## 2014-11-13 ENCOUNTER — Other Ambulatory Visit: Payer: Self-pay | Admitting: Family Medicine

## 2014-11-13 DIAGNOSIS — T17908A Unspecified foreign body in respiratory tract, part unspecified causing other injury, initial encounter: Secondary | ICD-10-CM

## 2014-12-07 ENCOUNTER — Encounter: Payer: Self-pay | Admitting: Family Medicine

## 2014-12-07 ENCOUNTER — Ambulatory Visit (INDEPENDENT_AMBULATORY_CARE_PROVIDER_SITE_OTHER): Payer: BLUE CROSS/BLUE SHIELD | Admitting: Family Medicine

## 2014-12-07 ENCOUNTER — Telehealth: Payer: Self-pay | Admitting: Family Medicine

## 2014-12-07 VITALS — BP 133/87 | HR 75 | Temp 98.2°F | Ht 67.0 in | Wt 132.6 lb

## 2014-12-07 DIAGNOSIS — N390 Urinary tract infection, site not specified: Secondary | ICD-10-CM

## 2014-12-07 LAB — POCT UA - MICROSCOPIC ONLY
BACTERIA, U MICROSCOPIC: NEGATIVE
CRYSTALS, UR, HPF, POC: NEGATIVE
Casts, Ur, LPF, POC: NEGATIVE
RBC, URINE, MICROSCOPIC: NEGATIVE
Yeast, UA: NEGATIVE

## 2014-12-07 LAB — POCT URINALYSIS DIPSTICK
BILIRUBIN UA: NEGATIVE
Blood, UA: NEGATIVE
GLUCOSE UA: NEGATIVE
KETONES UA: NEGATIVE
Leukocytes, UA: NEGATIVE
Nitrite, UA: NEGATIVE
Protein, UA: NEGATIVE
SPEC GRAV UA: 1.02
Urobilinogen, UA: NEGATIVE
pH, UA: 7

## 2014-12-07 MED ORDER — CEPHALEXIN 500 MG PO CAPS: 500.0000 mg | ORAL_CAPSULE | Freq: Three times a day (TID) | ORAL | Status: DC

## 2014-12-07 NOTE — Progress Notes (Signed)
Patient ID: Emily Doyle, female   DOB: Jun 05, 1945, 69 y.o.   MRN: [id number]   HPI  Patient presents today for same-day evaluation of malaise and fatigue.  Patient explains that about 3 weeks ago she had a colonoscopy. That same day after she went home she developed fever and malaise. Following day she had a chest x-ray which was clear.  She then went to the mouth was on a trip and continued to feel poorly. She called the GI clinic where she had the colonoscopy who advised her to go to the ER. She went to the ER at [facility] and had a wpork up that revealed UTI with UA with 2+ leuks and 3-5 WBCs While at the ED she had another chest x-ray which was clear and a CT abdomen which ruled out perforation, appendicitis, bowel traction, and diverticulitis. She did have a very tiny kidney stone which was not obstructing urinary tract. She was given Macrobid for the UTI and felt better for 3-4 days after she completed the course. Then her symptoms returned.  She has not had overt fever since that time. She states that she has had generalized malaise, fatigue, and just feels poor. She has a "almost headache" and just doesn't feel like herself.  She denies any new medications. She denies diarrhea, dyspnea, chest pain, cough, and palpitations. She has a history of SVT with correction after ablation.  PMH: Smoking status noted ROS: Per HPI  Objective: BP 133/87 mmHg  Pulse 75  Temp(Src) 98.2 F (36.8 C) (Oral)  Ht 5\' 7"  (1.702 m)  Wt 132 lb 9.6 oz (60.147 kg)  BMI 20.76 kg/m2 Gen: NAD, alert, cooperative with exam HEENT: NCAT CV: RRR, good S1/S2, no murmur Resp: CTABL, no wheezes, non-labored Abd: SNTND, BS present, no guarding or organomegaly Ext: No edema, warm Neuro: Alert and oriented, No gross deficits  Assessment and plan:  UTI (urinary tract infection) No evidence of UTI today but had return of symptoms after completing course of macrobid recently Send  urine for culture With nonspecific illness after Cscope considered Perf and other serious etiology but abd exam is benign and she had a normal CT scan at a recent ED visit.    Orders Placed This Encounter  Procedures  . POCT UA - Microscopic Only  . POCT urinalysis dipstick    Meds ordered this encounter  Medications  . Probiotic Product (PROBIOTIC DAILY PO)    Sig: Take by mouth.  . cephALEXin (KEFLEX) 500 MG capsule    Sig: Take 1 capsule (500 mg total) by mouth 3 (three) times daily.    Dispense:  21 capsule    Refill:  Branch, MD [facility] 12/07/2014, 4:47 PM

## 2014-12-07 NOTE — Telephone Encounter (Signed)
Appointment made with Dr. Wendi Snipes for possible UTI.  Patient aware of appointment time

## 2014-12-07 NOTE — Assessment & Plan Note (Signed)
No evidence of UTI today but had return of symptoms after completing course of macrobid recently Send urine for culture With nonspecific illness after Cscope considered Perf and other serious etiology but abd exam is benign and she had a normal CT scan at a recent ED visit.

## 2014-12-09 ENCOUNTER — Telehealth: Payer: Self-pay | Admitting: Family Medicine

## 2014-12-09 LAB — URINE CULTURE

## 2014-12-09 NOTE — Telephone Encounter (Signed)
I called and discussed her urine Culture results. Escherichia coli sensitive to first generation cyclosporins like Keflex but intermediate to Macrobid which she had previously.  She feels better, encouraged to continue and finish anti-biotics.  Follow-up as needed.   Laroy Apple, MD [facility] 12/09/2014, 5:25 PM

## 2015-02-08 ENCOUNTER — Ambulatory Visit (INDEPENDENT_AMBULATORY_CARE_PROVIDER_SITE_OTHER): Payer: BLUE CROSS/BLUE SHIELD | Admitting: Family Medicine

## 2015-02-08 ENCOUNTER — Encounter: Payer: Self-pay | Admitting: Family Medicine

## 2015-02-08 VITALS — BP 123/84 | HR 78 | Temp 98.8°F | Wt 135.4 lb

## 2015-02-08 DIAGNOSIS — N390 Urinary tract infection, site not specified: Secondary | ICD-10-CM

## 2015-02-08 DIAGNOSIS — M5431 Sciatica, right side: Secondary | ICD-10-CM | POA: Diagnosis not present

## 2015-02-08 LAB — POCT URINALYSIS DIPSTICK
BILIRUBIN UA: NEGATIVE
Blood, UA: NEGATIVE
Glucose, UA: NEGATIVE
KETONES UA: NEGATIVE
NITRITE UA: NEGATIVE
PROTEIN UA: NEGATIVE
Spec Grav, UA: 1.01
Urobilinogen, UA: NEGATIVE
pH, UA: 6

## 2015-02-08 LAB — POCT UA - MICROSCOPIC ONLY
Bacteria, U Microscopic: NEGATIVE
Casts, Ur, LPF, POC: NEGATIVE
Crystals, Ur, HPF, POC: NEGATIVE
MUCUS UA: NEGATIVE
RBC, URINE, MICROSCOPIC: NEGATIVE
Yeast, UA: NEGATIVE

## 2015-02-08 MED ORDER — HYDROCODONE-ACETAMINOPHEN 5-325 MG PO TABS: 1.0000 | ORAL_TABLET | Freq: Four times a day (QID) | ORAL | Status: DC | PRN

## 2015-02-08 MED ORDER — ZOLPIDEM TARTRATE 5 MG PO TABS: 5.0000 mg | ORAL_TABLET | Freq: Every evening | ORAL | Status: DC | PRN

## 2015-02-08 MED ORDER — PREDNISONE 20 MG PO TABS: ORAL_TABLET | ORAL | Status: DC

## 2015-02-08 NOTE — Patient Instructions (Signed)
Great to see you!  Try the hydrocodone at night when you are ready to stay home, it is a narcotic so use caution.   If this doesn't improve within 4-6 weeks we should do x rays, please come back f that's the case.   Sciatica Sciatica is pain, weakness, numbness, or tingling along the path of the sciatic nerve. The nerve starts in the lower back and runs down the back of each leg. The nerve controls the muscles in the lower leg and in the back of the knee, while also providing sensation to the back of the thigh, lower leg, and the sole of your foot. Sciatica is a symptom of another medical condition. For instance, nerve damage or certain conditions, such as a herniated disk or bone spur on the spine, pinch or put pressure on the sciatic nerve. This causes the pain, weakness, or other sensations normally associated with sciatica. Generally, sciatica only affects one side of the body. CAUSES   Herniated or slipped disc.  Degenerative disk disease.  A pain disorder involving the narrow muscle in the buttocks (piriformis syndrome).  Pelvic injury or fracture.  Pregnancy.  Tumor (rare). SYMPTOMS  Symptoms can vary from mild to very severe. The symptoms usually travel from the low back to the buttocks and down the back of the leg. Symptoms can include:  Mild tingling or dull aches in the lower back, leg, or hip.  Numbness in the back of the calf or sole of the foot.  Burning sensations in the lower back, leg, or hip.  Sharp pains in the lower back, leg, or hip.  Leg weakness.  Severe back pain inhibiting movement. These symptoms may get worse with coughing, sneezing, laughing, or prolonged sitting or standing. Also, being overweight may worsen symptoms. DIAGNOSIS  Your caregiver will perform a physical exam to look for common symptoms of sciatica. He or she may ask you to do certain movements or activities that would trigger sciatic nerve pain. Other tests may be performed to find the  cause of the sciatica. These may include:  Blood tests.  X-rays.  Imaging tests, such as an MRI or CT scan. TREATMENT  Treatment is directed at the cause of the sciatic pain. Sometimes, treatment is not necessary and the pain and discomfort goes away on its own. If treatment is needed, your caregiver may suggest:  Over-the-counter medicines to relieve pain.  Prescription medicines, such as anti-inflammatory medicine, muscle relaxants, or narcotics.  Applying heat or ice to the painful area.  Steroid injections to lessen pain, irritation, and inflammation around the nerve.  Reducing activity during periods of pain.  Exercising and stretching to strengthen your abdomen and improve flexibility of your spine. Your caregiver may suggest losing weight if the extra weight makes the back pain worse.  Physical therapy.  Surgery to eliminate what is pressing or pinching the nerve, such as a bone spur or part of a herniated disk. HOME CARE INSTRUCTIONS   Only take over-the-counter or prescription medicines for pain or discomfort as directed by your caregiver.  Apply ice to the affected area for 20 minutes, 3-4 times a day for the first 48-72 hours. Then try heat in the same way.  Exercise, stretch, or perform your usual activities if these do not aggravate your pain.  Attend physical therapy sessions as directed by your caregiver.  Keep all follow-up appointments as directed by your caregiver.  Do not wear high heels or shoes that do not provide proper support.  Check your mattress to see if it is too soft. A firm mattress may lessen your pain and discomfort. SEEK IMMEDIATE MEDICAL CARE IF:   You lose control of your bowel or bladder (incontinence).  You have increasing weakness in the lower back, pelvis, buttocks, or legs.  You have redness or swelling of your back.  You have a burning sensation when you urinate.  You have pain that gets worse when you lie down or awakens you  at night.  Your pain is worse than you have experienced in the past.  Your pain is lasting longer than 4 weeks.  You are suddenly losing weight without reason. MAKE SURE YOU:  Understand these instructions.  Will watch your condition.  Will get help right away if you are not doing well or get worse.   This information is not intended to replace advice given to you by your health care provider. Make sure you discuss any questions you have with your health care provider.   Document Released: 04/04/2001 Document Revised: 12/30/2014 Document Reviewed: 08/20/2011 Elsevier Interactive Patient Education [employer].

## 2015-02-08 NOTE — Progress Notes (Signed)
   HPI  Patient presents today here for complaints of low back pain. She describes a intermittently sharp right-sided back pain with dull continuous pain. It's radiating down her right lateral hip right medial hip, and down the posterior part of her leg. Single and on for 3 weeks with no inciting trauma. She denies any hematuria, urinary frequency, dysuria, fevers, chills, sweats.  She's had this problem once before the self resolved after 3-4 weeks.  She is concerned she has a trip to [place] planned in a week and would like to be sure that she does not have pain as she is walking around  She also read request 6 Ambien to help her with jet lag and sleeping on the plane.    PMH: Smoking status noted ROS: Per HPI  Objective: BP 123/84 mmHg  Pulse 78  Temp(Src) 98.8 F (37.1 C) (Oral)  Wt 135 lb 6.4 oz (61.417 kg) Gen: NAD, alert, cooperative with exam HEENT: NCAT Ext: No edema, warm Neuro: Alert and oriented, No gross deficits Musculoskeletal: No lumbar spine or paraspinal muscle tenderness, negative Fabere and logroll except for increased pain in the right lower back, full range of motion of the right hip   Assessment and plan:  # Sciatica With groin pain and was initially worried about hip etiology, however her hip specific exams all cause low back pain rather than groin pain, Prednisone Norco for relief of pain prior to prednisone, given caution as this is a narcotic   # Jet lag Given Ambien as requested  Previous UTI Trace leukocyte esterase, 10-15 WBCs per high-power field Sent for culture   Orders Placed This Encounter  Procedures  . POCT UA - Microscopic Only  . POCT urinalysis dipstick    Meds ordered this encounter  Medications  . predniSONE (DELTASONE) 20 MG tablet    Sig: Take 2 tabs daily for 4 days, then take 1 tab daily for 4 days, then take one half tab daily 4 days then stop.    Dispense:  14 tablet    Refill:  0  . HYDROcodone-acetaminophen  (NORCO) 5-325 MG tablet    Sig: Take 1 tablet by mouth every 6 (six) hours as needed for moderate pain.    Dispense:  30 tablet    Refill:  0  . zolpidem (AMBIEN) 5 MG tablet    Sig: Take 1 tablet (5 mg total) by mouth at bedtime as needed for sleep.    Dispense:  6 tablet    Refill:  0    Laroy Apple, MD [facility] 02/08/2015, 2:38 PM

## 2015-02-09 ENCOUNTER — Telehealth: Payer: Self-pay

## 2015-02-09 NOTE — Telephone Encounter (Signed)
Called and discussed, no symptoms of illness.   Mild HA and Tmax 99.2  Started prednisone today Recommended close f/u if symptoms of illness develop including dyspnea,. Cough, dysuria or other concerning symptoms. I have also sent urine for a culture given trace leuks but she is largely asymptomatic.   Reassurance, low threshold for RTC as they are leaving the country this weekend.   Laroy Apple, MD [facility] 02/09/2015, 3:59 PM

## 2015-02-10 LAB — URINE CULTURE: Organism ID, Bacteria: NO GROWTH

## 2015-02-16 ENCOUNTER — Ambulatory Visit: Payer: BC Managed Care – PPO | Admitting: Family Medicine

## 2015-02-25 ENCOUNTER — Encounter: Payer: Self-pay | Admitting: Family Medicine

## 2015-02-25 ENCOUNTER — Ambulatory Visit (INDEPENDENT_AMBULATORY_CARE_PROVIDER_SITE_OTHER): Payer: BLUE CROSS/BLUE SHIELD | Admitting: Family Medicine

## 2015-02-25 VITALS — BP 122/77 | HR 81 | Temp 98.1°F | Ht 67.0 in | Wt 135.8 lb

## 2015-02-25 DIAGNOSIS — M5431 Sciatica, right side: Secondary | ICD-10-CM | POA: Diagnosis not present

## 2015-02-25 NOTE — Progress Notes (Signed)
   HPI  Patient presents today  Here follow-up of rigt-sidedscatica a hp pain.  She had a trip to [place] and did pretty well throughout her trip. Sh did have contniued pain but it did not limit her function  she returned from her tripon Monday, 3days ago, since that time she's imprved with only using iuprofen.   She brought me a book today "healing back pain, the mind body connection"   PMH: Smoking status noted ROS: Per HPI  Objective: BP 122/77 mmHg  Pulse 81  Temp(Src) 98.1 F (36.7 C) (Oral)  Ht 5\' 7"  (1.702 m)  Wt 135 lb 12.8 oz (61.598 kg)  BMI 21.26 kg/m2 Gen: NAD, alert, cooperative with exam HEENT: NCAT Neuro: Alert and oriented, No gross deficits  Assessment and plan:  # right-sided sciatica Improving She would like to continue conservative treatment Consider sports medicine referral to [facility] if  continued.     Laroy Apple, MD [facility] 02/25/2015, 4:34 PM

## 2015-02-26 ENCOUNTER — Other Ambulatory Visit: Payer: Self-pay | Admitting: Family Medicine

## 2015-03-03 ENCOUNTER — Encounter: Payer: Self-pay | Admitting: Family Medicine

## 2015-03-03 ENCOUNTER — Ambulatory Visit (INDEPENDENT_AMBULATORY_CARE_PROVIDER_SITE_OTHER): Payer: BLUE CROSS/BLUE SHIELD | Admitting: Family Medicine

## 2015-03-03 VITALS — BP 136/84 | HR 81 | Temp 97.7°F | Ht 67.0 in | Wt 133.0 lb

## 2015-03-03 DIAGNOSIS — I10 Essential (primary) hypertension: Secondary | ICD-10-CM

## 2015-03-03 DIAGNOSIS — G4700 Insomnia, unspecified: Secondary | ICD-10-CM

## 2015-03-03 DIAGNOSIS — Z0000 Encounter for general adult medical examination without abnormal findings: Secondary | ICD-10-CM

## 2015-03-03 DIAGNOSIS — E538 Deficiency of other specified B group vitamins: Secondary | ICD-10-CM

## 2015-03-03 DIAGNOSIS — Z780 Asymptomatic menopausal state: Secondary | ICD-10-CM

## 2015-03-03 DIAGNOSIS — E785 Hyperlipidemia, unspecified: Secondary | ICD-10-CM

## 2015-03-03 DIAGNOSIS — R7989 Other specified abnormal findings of blood chemistry: Secondary | ICD-10-CM

## 2015-03-03 DIAGNOSIS — J301 Allergic rhinitis due to pollen: Secondary | ICD-10-CM

## 2015-03-03 MED ORDER — LISINOPRIL 10 MG PO TABS: 10.0000 mg | ORAL_TABLET | Freq: Every day | ORAL | Status: DC

## 2015-03-03 MED ORDER — MONTELUKAST SODIUM 10 MG PO TABS: 10.0000 mg | ORAL_TABLET | Freq: Every day | ORAL | Status: DC

## 2015-03-03 NOTE — Patient Instructions (Addendum)
Medicare Annual Wellness Visit  [facility] and the medical providers at [facility] strive to bring you the best medical care.  In doing so we not only want to address your current medical conditions and concerns but also to detect new conditions early and prevent illness, disease and health-related problems.    Medicare offers a yearly Wellness Visit which allows our clinical staff to assess your need for preventative services including immunizations, lifestyle education, counseling to decrease risk of preventable diseases and screening for fall risk and other medical concerns.    This visit is provided free of charge (no copay) for all Medicare recipients. The clinical pharmacists at [facility] have begun to conduct these Wellness Visits which will also include a thorough review of all your medications.    As you primary medical provider recommend that you make an appointment for your Annual Wellness Visit if you have not done so already this year.  You may set up this appointment before you leave today or you may call back ([phone]) and schedule an appointment.  Please make sure when you call that you mention that you are scheduling your Annual Wellness Visit with the clinical pharmacist so that the appointment may be made for the proper length of time.     Continue current medications. Continue good therapeutic lifestyle changes which include good diet and exercise. Fall precautions discussed with patient. If an FOBT was given today- please return it to our front desk. If you are over 54 years old - you may need Prevnar 70 or the adult Pneumonia vaccine.  **Flu shots are available--- please call and schedule a FLU-CLINIC appointment**  After your visit with [place] today you will receive a survey in the mail or online from [employer] regarding your care with [place]. Please take a moment to fill this out. Your feedback is very  important to [place] as you can help [place] better understand your patient needs as well as improve your experience and satisfaction. WE CARE ABOUT YOU!!!   The patient should continue to take her current medication. She should avoid irritating environments to help protect her from allergy issues. She should follow-up with her dermatologist as planned in January She needs to make sure that she gets her pelvic exam and mammogram and DEXA scan done that we get a copy of this report We will call when the lab work becomes available and she should take a copy of this with her when she goes to get her pelvic exam She should get her flu shot at her husband's work place

## 2015-03-03 NOTE — Progress Notes (Signed)
Subjective:    Patient ID: URI COVEY, female    DOB: 04/06/1946, 69 y.o.   MRN: [id number]  HPI Patient is here today for annual wellness exam and follow up of chronic medical problems which includes hypertension and hyperlipidemia. She is taking medications regularly. The patient has no specific complaints. She continues to have sciatica. She has a history of allergic rhinitis hyperlipidemia hypertension and occasional bouts of tachycardia. She also has a history of colon polyps. Her last colonoscopy was actually the summer. Her mammogram is due are either the last one was in September 2015. She is going to get her flu shot this coming week. She is requesting refills on her lisinopril and Singulair. The patient denies chest pain shortness of breath trouble swallowing and heartburn indigestion nausea vomiting diarrhea or blood in the stool. She says when she has her blood pressures checked outside the office they're using in the 120s over the 70s. Her sister had uterine cancer and she is particular by getting her mammograms pelvic sounds and Pap smears done regularly and this is coming up soon for her. She will also get her DEXA scan. She will make sure that she gets a copy of that information to [place] after those tests are done. The family history is positive for Alzheimer's in her mother and hydrocephalus and her father.     Patient Active Problem List   Diagnosis Date Noted  . Sciatica of right side 02/08/2015  . UTI (urinary tract infection) 12/07/2014  . Family history of COPD (chronic obstructive pulmonary disease) 02/11/2014  . Allergic rhinitis due to pollen 02/11/2014  . Vitamin B 12 deficiency 07/13/2010  . Hyperlipidemia   . HTN (hypertension), benign   . Tachycardia   . Diverticulosis   . Colon polyps    Outpatient Encounter Prescriptions as of 03/03/2015  Medication Sig  . aspirin 81 MG EC tablet Take 81 mg by mouth daily.   . Cholecalciferol (VITAMIN D3) 5000 UNITS TABS  Take 5,000 Units by mouth daily.   . cyanocobalamin (,VITAMIN B-12,) 1000 MCG/ML injection INJECT 1 ML (1000MG) weekly for 4 weeks and then resume monthly  . HYDROcodone-acetaminophen (NORCO) 5-325 MG tablet Take 1 tablet by mouth every 6 (six) hours as needed for moderate pain.  . Ibuprofen-Diphenhydramine Cit (ADVIL PM PO) Take 1 tablet by mouth at bedtime.  Marland Kitchen lisinopril (PRINIVIL,ZESTRIL) 10 MG tablet TAKE 1 TABLET DAILY  . montelukast (SINGULAIR) 10 MG tablet TAKE 1 TABLET DAILY  . Multiple Vitamin (MULTIVITAMIN WITH MINERALS) TABS tablet Take 1 tablet by mouth daily.  . pravastatin (PRAVACHOL) 40 MG tablet TAKE 1 & 1/2 TABLET AT BEDTIME  . Probiotic Product (PROBIOTIC DAILY PO) Take by mouth.  . zolpidem (AMBIEN) 5 MG tablet Take 1 tablet (5 mg total) by mouth at bedtime as needed for sleep.   No facility-administered encounter medications on file as of 03/03/2015.      Review of Systems  Constitutional: Negative.   HENT: Negative.   Eyes: Negative.   Respiratory: Negative.   Cardiovascular: Negative.   Gastrointestinal: Negative.   Endocrine: Negative.   Genitourinary: Negative.   Musculoskeletal: Negative.   Skin: Negative.   Allergic/Immunologic: Negative.   Neurological: Negative.   Hematological: Negative.   Psychiatric/Behavioral: Negative.        Objective:   Physical Exam  Constitutional: She is oriented to person, place, and time. She appears well-developed and well-nourished. No distress.  HENT:  Head: Normocephalic and atraumatic.  Right Ear: External  ear normal.  Left Ear: External ear normal.  Mouth/Throat: Oropharynx is clear and moist.  Nasal congestion and turbinate swelling right greater than left the patient is having no symptoms as she is taking her Singulair.  Eyes: Conjunctivae and EOM are normal. Pupils are equal, round, and reactive to light. Right eye exhibits no discharge. Left eye exhibits no discharge. No scleral icterus.  Neck: Normal range  of motion. Neck supple. No thyromegaly present.  Neck without bruits or thyromegaly  Cardiovascular: Normal rate, regular rhythm, normal heart sounds and intact distal pulses.   No murmur heard. The heart had a regular rate and rhythm at 72/m  Pulmonary/Chest: Effort normal and breath sounds normal. No respiratory distress. She has no wheezes. She has no rales. She exhibits no tenderness.  Clear anteriorly and posteriorly  Abdominal: Soft. Bowel sounds are normal. She exhibits no mass. There is no tenderness. There is no rebound and no guarding.  Nontender without masses or organ enlargement bruits  Musculoskeletal: Normal range of motion. She exhibits no edema.  Lymphadenopathy:    She has no cervical adenopathy.  Neurological: She is alert and oriented to person, place, and time. She has normal reflexes. No cranial nerve deficit.  Skin: Skin is warm and dry. No rash noted.  Psychiatric: She has a normal mood and affect. Her behavior is normal. Judgment and thought content normal.  Nursing note and vitals reviewed.  BP 136/84 mmHg  Pulse 81  Temp(Src) 97.7 F (36.5 C) (Oral)  Ht '5\' 7"'  (1.702 m)  Wt 133 lb (60.328 kg)  BMI 20.83 kg/m2        Assessment & Plan:  1. Hyperlipidemia -Continue pravastatin pending results of lab work - CBC with Differential/Platelet - NMR, lipoprofile  2. HTN (hypertension), benign -Continue lisinopril as blood pressure is good today. - BMP8+EGFR - CBC with Differential/Platelet - Hepatic function panel  3. Annual physical exam -The patient has plans to get her flu shot mammogram and DEXA scan pelvic exam and Pap smear. - BMP8+EGFR - CBC with Differential/Platelet - Hepatic function panel - NMR, lipoprofile - Vit D  25 hydroxy (rtn osteoporosis monitoring) - Thyroid Panel With TSH  4. Vitamin B 12 deficiency -We will check her B12 level at this visit - CBC with Differential/Platelet - Vitamin B12  5. Postmenopausal -She is not having  any vasomotor spasms and she plans to get her pelvic exam soon with her gynecologist - CBC with Differential/Platelet  6. Insomnia -Continue Ambien as needed  7. Allergic rhinitis due to pollen -Continue Singulair and this winter use nasal saline each nostril frequently and keep the house as cool as possible  Meds ordered this encounter  Medications  . lisinopril (PRINIVIL,ZESTRIL) 10 MG tablet    Sig: Take 1 tablet (10 mg total) by mouth daily.    Dispense:  90 tablet    Refill:  3  . montelukast (SINGULAIR) 10 MG tablet    Sig: Take 1 tablet (10 mg total) by mouth daily.    Dispense:  90 tablet    Refill:  3   Patient Instructions                       Medicare Annual Wellness Visit  [facility] and the medical providers at [facility] strive to bring you the best medical care.  In doing so we not only want to address your current medical conditions and concerns but also to detect new conditions  early and prevent illness, disease and health-related problems.    Medicare offers a yearly Wellness Visit which allows our clinical staff to assess your need for preventative services including immunizations, lifestyle education, counseling to decrease risk of preventable diseases and screening for fall risk and other medical concerns.    This visit is provided free of charge (no copay) for all Medicare recipients. The clinical pharmacists at [facility] have begun to conduct these Wellness Visits which will also include a thorough review of all your medications.    As you primary medical provider recommend that you make an appointment for your Annual Wellness Visit if you have not done so already this year.  You may set up this appointment before you leave today or you may call back ([phone]) and schedule an appointment.  Please make sure when you call that you mention that you are scheduling your Annual Wellness Visit with the clinical  pharmacist so that the appointment may be made for the proper length of time.     Continue current medications. Continue good therapeutic lifestyle changes which include good diet and exercise. Fall precautions discussed with patient. If an FOBT was given today- please return it to our front desk. If you are over 40 years old - you may need Prevnar 72 or the adult Pneumonia vaccine.  **Flu shots are available--- please call and schedule a FLU-CLINIC appointment**  After your visit with [place] today you will receive a survey in the mail or online from [employer] regarding your care with [place]. Please take a moment to fill this out. Your feedback is very important to [place] as you can help [place] better understand your patient needs as well as improve your experience and satisfaction. WE CARE ABOUT YOU!!!   The patient should continue to take her current medication. She should avoid irritating environments to help protect her from allergy issues. She should follow-up with her dermatologist as planned in January She needs to make sure that she gets her pelvic exam and mammogram and DEXA scan done that we get a copy of this report We will call when the lab work becomes available and she should take a copy of this with her when she goes to get her pelvic exam She should get her flu shot at her husband's work place   Arrie Senate MD

## 2015-03-04 LAB — CBC WITH DIFFERENTIAL/PLATELET
BASOS ABS: 0 10*3/uL (ref 0.0–0.2)
BASOS: 1 %
EOS (ABSOLUTE): 0.1 10*3/uL (ref 0.0–0.4)
Eos: 2 %
Hematocrit: 33.1 % — ABNORMAL LOW (ref 34.0–46.6)
Hemoglobin: 11 g/dL — ABNORMAL LOW (ref 11.1–15.9)
IMMATURE GRANS (ABS): 0 10*3/uL (ref 0.0–0.1)
Immature Granulocytes: 0 %
LYMPHS: 32 %
Lymphocytes Absolute: 1.2 10*3/uL (ref 0.7–3.1)
MCH: 35.5 pg — ABNORMAL HIGH (ref 26.6–33.0)
MCHC: 33.2 g/dL (ref 31.5–35.7)
MCV: 107 fL — ABNORMAL HIGH (ref 79–97)
Monocytes Absolute: 0.3 10*3/uL (ref 0.1–0.9)
Monocytes: 9 %
NEUTROS PCT: 56 %
Neutrophils Absolute: 2.2 10*3/uL (ref 1.4–7.0)
PLATELETS: 351 10*3/uL (ref 150–379)
RBC: 3.1 x10E6/uL — AB (ref 3.77–5.28)
RDW: 15.8 % — AB (ref 12.3–15.4)
WBC: 3.9 10*3/uL (ref 3.4–10.8)

## 2015-03-04 LAB — BMP8+EGFR
BUN/Creatinine Ratio: 28 — ABNORMAL HIGH (ref 11–26)
BUN: 14 mg/dL (ref 8–27)
CALCIUM: 9.2 mg/dL (ref 8.7–10.3)
CHLORIDE: 105 mmol/L (ref 97–106)
CO2: 24 mmol/L (ref 18–29)
Creatinine, Ser: 0.5 mg/dL — ABNORMAL LOW (ref 0.57–1.00)
GFR calc Af Amer: 114 mL/min/{1.73_m2} (ref >59)
GFR calc non Af Amer: 99 mL/min/{1.73_m2} (ref >59)
GLUCOSE: 83 mg/dL (ref 65–99)
POTASSIUM: 4.9 mmol/L (ref 3.5–5.2)
Sodium: 144 mmol/L (ref 136–144)

## 2015-03-04 LAB — HEPATIC FUNCTION PANEL
ALT: 18 IU/L (ref 0–32)
AST: 21 IU/L (ref 0–40)
Albumin: 4.4 g/dL (ref 3.6–4.8)
Alkaline Phosphatase: 68 IU/L (ref 39–117)
BILIRUBIN TOTAL: 0.3 mg/dL (ref 0.0–1.2)
Bilirubin, Direct: 0.1 mg/dL (ref 0.00–0.40)
TOTAL PROTEIN: 6.7 g/dL (ref 6.0–8.5)

## 2015-03-04 LAB — NMR, LIPOPROFILE
CHOLESTEROL: 214 mg/dL — AB (ref 100–199)
HDL CHOLESTEROL BY NMR: 117 mg/dL (ref >39)
HDL Particle Number: 47 umol/L (ref >=30.5)
LDL Particle Number: 824 nmol/L (ref <1000)
LDL Size: 21.2 nm (ref >20.5)
LDL-C: 87 mg/dL (ref 0–99)
LP-IR Score: 31 (ref <=45)
TRIGLYCERIDES BY NMR: 48 mg/dL (ref 0–149)

## 2015-03-04 LAB — VITAMIN D 25 HYDROXY (VIT D DEFICIENCY, FRACTURES): Vit D, 25-Hydroxy: 54.4 ng/mL (ref 30.0–100.0)

## 2015-03-04 LAB — THYROID PANEL WITH TSH
FREE THYROXINE INDEX: 2.3 (ref 1.2–4.9)
T3 Uptake Ratio: 32 % (ref 24–39)
T4 TOTAL: 7.3 ug/dL (ref 4.5–12.0)
TSH: 0.302 u[IU]/mL — AB (ref 0.450–4.500)

## 2015-03-04 LAB — VITAMIN B12: VITAMIN B 12: 285 pg/mL (ref 211–946)

## 2015-03-04 NOTE — Addendum Note (Signed)
Addended by: Ilean China on: 03/04/2015 05:32 PM   Modules accepted: Orders

## 2015-03-06 LAB — SPECIMEN STATUS REPORT

## 2015-03-06 LAB — IRON AND TIBC
IRON SATURATION: 40 % (ref 15–55)
IRON: 115 ug/dL (ref 27–139)
Total Iron Binding Capacity: 291 ug/dL (ref 250–450)
UIBC: 176 ug/dL (ref 118–369)

## 2015-03-22 ENCOUNTER — Telehealth: Payer: Self-pay | Admitting: Family Medicine

## 2015-03-22 DIAGNOSIS — M5431 Sciatica, right side: Secondary | ICD-10-CM

## 2015-03-22 NOTE — Telephone Encounter (Signed)
Patient aware and referral placed based on Dr. Alen Bleacher note.

## 2015-04-06 ENCOUNTER — Encounter: Payer: Self-pay | Admitting: Family Medicine

## 2015-04-12 ENCOUNTER — Ambulatory Visit (INDEPENDENT_AMBULATORY_CARE_PROVIDER_SITE_OTHER): Payer: BLUE CROSS/BLUE SHIELD | Admitting: Family Medicine

## 2015-04-12 ENCOUNTER — Ambulatory Visit (INDEPENDENT_AMBULATORY_CARE_PROVIDER_SITE_OTHER)
Admission: RE | Admit: 2015-04-12 | Discharge: 2015-04-12 | Disposition: A | Discharge status: Home / Self Care | Payer: BLUE CROSS/BLUE SHIELD | Source: Ambulatory Visit | Attending: Family Medicine | Admitting: Family Medicine

## 2015-04-12 ENCOUNTER — Encounter: Payer: Self-pay | Admitting: Family Medicine

## 2015-04-12 VITALS — BP 138/84 | HR 88 | Ht 67.0 in | Wt 137.0 lb

## 2015-04-12 DIAGNOSIS — M7061 Trochanteric bursitis, right hip: Secondary | ICD-10-CM | POA: Diagnosis not present

## 2015-04-12 DIAGNOSIS — M1990 Unspecified osteoarthritis, unspecified site: Secondary | ICD-10-CM

## 2015-04-12 DIAGNOSIS — M25551 Pain in right hip: Secondary | ICD-10-CM | POA: Diagnosis not present

## 2015-04-12 DIAGNOSIS — M1611 Unilateral primary osteoarthritis, right hip: Secondary | ICD-10-CM | POA: Insufficient documentation

## 2015-04-12 MED ORDER — GABAPENTIN 100 MG PO CAPS: 100.0000 mg | ORAL_CAPSULE | Freq: Every day | ORAL | Status: DC

## 2015-04-12 NOTE — Progress Notes (Signed)
[facility] [place], [place] [address] Phone: [phone] Subjective:    I'm seeing this patient by the request  of:  Redge Gainer, MD   CC: right leg pain  [id number] Emily Doyle is a 69 y.o. female coming in with complaint of right leg pain. Patient states that this is been intermittent for quite some times. Patient has states though when she does not have any pain and then days when she has significant pain. Patient describes it as a dull, throbbing aching sensation. Patient states though that sometimes it can have almost a sharp pain with certain movements. States that it seems to start on the lateral aspect of the hip but can go anteriorly and then down the medial aspect of her thigh towards her knee and foot. Patient states that this is intermittent and can have certain parts without other parts hurting. Denies any weakness. Denies any numbness. Patient does not remember any true injury. States though that the pain can wake her up at night. Rates the severity of pain is 8 out of 10. Patient is more concerned because it does not seem to be any reason for the pain and can happen at any time. Patient states even sometimes when she is driving and goes from the gas pedal to the break can be uncomfortable.  Past Medical History  Diagnosis Date  . Tachycardia   . Diverticulosis   . History of colon polyps   . Migraine with aura   . Vitamin B 12 deficiency   . Complication of anesthesia     slow to wake up  . Other and unspecified hyperlipidemia   . HTN (hypertension), benign    Past Surgical History  Procedure Laterality Date  . Ablation for tachacardia  2012  . Breast cyst aspiration  2000  . Inguinal hernia repair Right 02/05/2013    Procedure: HERNIA REPAIR femoral ADULT;  Surgeon: Shann Medal, MD;  Location: [facility];  Service: General;  Laterality: Right;  with MESH   Social History  Substance Use Topics  . Smoking status:  Former Smoker -- 1.00 packs/day for 10 years    Types: Cigarettes    Quit date: 04/24/1982  . Smokeless tobacco: Never Used  . Alcohol Use: Yes     Comment: 2 glass of wine per day   Allergies  Allergen Reactions  . Other Other (See Comments)    Msg= cramps and diarrhea   Family History  Problem Relation Age of Onset  . Alzheimer's disease Mother   . Cancer Father     prostate        Past medical history, social, surgical and family history all reviewed in electronic medical record.   Review of Systems: No headache, visual changes, nausea, vomiting, diarrhea, constipation, dizziness, abdominal pain, skin rash, fevers, chills, night sweats, weight loss, swollen lymph nodes, body aches, joint swelling, muscle aches, chest pain, shortness of breath, mood changes.   Objective Blood pressure 138/84, pulse 88, height 5\' 7"  (1.702 m), weight 137 lb (62.143 kg), SpO2 96 %.  General: No apparent distress alert and oriented x3 mood and affect normal, dressed appropriately.  HEENT: Pupils equal, extraocular movements intact  Respiratory: Patient's speak in full sentences and does not appear short of breath  Cardiovascular: No lower extremity edema, non tender, no erythema  Skin: Warm dry intact with no signs of infection or rash on extremities or on axial skeleton.  Abdomen: Soft nontender  Neuro: Cranial nerves II through XII are intact, neurovascularly intact in all extremities with 2+ DTRs and 2+ pulses.  Lymph: No lymphadenopathy of posterior or anterior cervical chain or axillae bilaterally.  Gait normal with good balance and coordination.  MSK:  Non tender with full range of motion and good stability and symmetric strength and tone of shoulders, elbows, wrist,  knee and ankles bilaterally.  Hip: Right ROM IR: 15 Deg with pain with internal rotation, ER: 3 Deg with mild pain as well, Flexion: 120 Deg, Extension: 100 Deg, Abduction: 45 Deg, Adduction: 45 Deg Strength IR: 5/5, ER:  5/5, Flexion: 5/5 but does have pain on the right side compared to no pain on the left side but symmetric in strength, Extension: 5/5, Abduction: 5/5, Adduction: 5/5 Pelvic alignment unremarkable to inspection and palpation. Standing hip rotation and gait without trendelenburg sign / unsteadiness. Minimal pain over the greater trochanteric area. No tenderness over piriformis  pain with FABER and FADIR. Patient also has pain with flexion of the hip No SI joint tenderness and normal minimal SI movement. Contralateral hip unremarkable. Negative straight leg test bilaterally   Impression and Recommendations:     This case required medical decision making of moderate complexity.

## 2015-04-12 NOTE — Patient Instructions (Signed)
Good to see you Ice is your friend Ice 20 minutes 2 times daily. Usually after activity and before bed. pennsaid pinkie amount topically 2 times daily as needed.  Turmeric 500mg  twice daily Continue the vitamin D Krill oil 2 grams daily Good shoes with rigid bottom.  Jalene Mullet, [place]rell or New balance greater then 700  Exercises 3 times a week.  See me again in 3-4 weeks.

## 2015-04-12 NOTE — Assessment & Plan Note (Signed)
Patient's right hip pain seems to be quite broad. Patient states that most the pain seemed to be more on the lateral aspect of the hip initially. This makes one concern for more of a greater trochanteric bursitis. Patient was minimally tender to palpation today. Patient also has some groin pain associated with it. I do think the patient has some arthritis and x-rays are pending. We discussed also with patient having radicular symptoms that seem to be going down and more of an L2, L3 nerve root correspondence. This is more concerning for that. We also discussed with patient about different treatment options. Patient has elected to try the gabapentin causes to severe pain at night seem to be what was more concerning. Denies any back pain associated with it. Patient though will try this to see if this will help. In addition of this patient has had difficulty with B12 deficiency and we discussed continuing this as well as B6 supplementation. Home exercises given for more of the greater trochanteric bursitis. Depending on findings and patient's response to conservative therapy will decide if any injections or further imaging will be necessary at follow-up in 3 weeks.

## 2015-04-12 NOTE — Progress Notes (Signed)
Pre visit review using our clinic review tool, if applicable. No additional management support is needed unless otherwise documented below in the visit note. 

## 2015-04-21 NOTE — Progress Notes (Signed)
Results available per My Chart for patient.

## 2015-05-05 ENCOUNTER — Encounter: Payer: Self-pay | Admitting: Family Medicine

## 2015-05-05 ENCOUNTER — Ambulatory Visit (INDEPENDENT_AMBULATORY_CARE_PROVIDER_SITE_OTHER): Payer: BLUE CROSS/BLUE SHIELD | Admitting: Family Medicine

## 2015-05-05 ENCOUNTER — Other Ambulatory Visit (INDEPENDENT_AMBULATORY_CARE_PROVIDER_SITE_OTHER): Payer: BLUE CROSS/BLUE SHIELD

## 2015-05-05 ENCOUNTER — Ambulatory Visit (INDEPENDENT_AMBULATORY_CARE_PROVIDER_SITE_OTHER)
Admission: RE | Admit: 2015-05-05 | Discharge: 2015-05-05 | Disposition: A | Discharge status: Home / Self Care | Payer: BLUE CROSS/BLUE SHIELD | Source: Ambulatory Visit | Attending: Family Medicine | Admitting: Family Medicine

## 2015-05-05 VITALS — BP 126/72 | HR 86 | Ht 67.0 in | Wt 138.0 lb

## 2015-05-05 DIAGNOSIS — M7061 Trochanteric bursitis, right hip: Secondary | ICD-10-CM

## 2015-05-05 DIAGNOSIS — M1990 Unspecified osteoarthritis, unspecified site: Secondary | ICD-10-CM | POA: Diagnosis not present

## 2015-05-05 DIAGNOSIS — M1611 Unilateral primary osteoarthritis, right hip: Secondary | ICD-10-CM

## 2015-05-05 DIAGNOSIS — E538 Deficiency of other specified B group vitamins: Secondary | ICD-10-CM | POA: Diagnosis not present

## 2015-05-05 DIAGNOSIS — M5431 Sciatica, right side: Secondary | ICD-10-CM

## 2015-05-05 NOTE — Assessment & Plan Note (Signed)
Patient is having some signs of potential sciatica now with the straight leg test that is new finding today. If patient does not respond to this injection and would want further workup of her back. Patient is having x-rays today and we did increase patient's gabapentin to 200 mg at night. Prescription given. Patient come back in 4 weeks.

## 2015-05-05 NOTE — Progress Notes (Signed)
Pre visit review using our clinic review tool, if applicable. No additional management support is needed unless otherwise documented below in the visit note. 

## 2015-05-05 NOTE — Patient Instructions (Signed)
Good to see you Xray of back downstairs Ice is your friend Stay active overall but avoid our exercises until Monday and then 3 times a week If too much pain then stop the exercises Gabapentin now 200mg  at night  OK to still take the advil as well.  See me again in 2 weeks and we will consider physical therapy or consider looking at your back more.

## 2015-05-05 NOTE — Assessment & Plan Note (Signed)
We'll continue to monitor 

## 2015-05-05 NOTE — Assessment & Plan Note (Signed)
This could be also contribute in. Encourage her to continue with the supplementation.

## 2015-05-05 NOTE — Assessment & Plan Note (Signed)
Injected her hip today. I do think that this will be beneficial. Patient had fairly good resolution of pain and stated that this felt the best has been in months. We discussed icing regimen. Patient will start the exercises again in 5 days. Patient and will come back and see me again in 4 weeks to make sure that she continues to improve. Differential includes lumbar radiculopathy as well as the arthritis the hip and we will further evaluate if no significant improvement.

## 2015-05-05 NOTE — Progress Notes (Signed)
[facility] [address] [place], [place] [address] Phone: [phone] Subjective:    I'm seeing this patient by the request  of:  Redge Gainer, MD   CC: right leg pain f/u [id number] Emily Doyle is a 70 y.o. female coming in with complaint of right leg pain. Patient was previously seen and did have some mild arthritic changes of the hip also is having what appeared to be more of a sciatica on the right side. Patient had some tenderness on the right greater trochanteric area. Patient elected try conservative therapy. Patient describes it is still minimal benefit and improvement. Unable to do the exercises secondary to pain. Seems be having some mild increase in low back pain. More pain still on the side of the hip. Especially when she's been sitting for a while or when she gets up in the morning. Seems to get a little better with activity but in the radiation down the leg occurs with some mild numbness on the right side. Patient states that she can feel somewhat unsure on her feet. States that the gabapentin may have helped 5%. Side affects.  Past Medical History  Diagnosis Date  . Tachycardia   . Diverticulosis   . History of colon polyps   . Migraine with aura   . Vitamin B 12 deficiency   . Complication of anesthesia     slow to wake up  . Other and unspecified hyperlipidemia   . HTN (hypertension), benign    Past Surgical History  Procedure Laterality Date  . Ablation for tachacardia  2012  . Breast cyst aspiration  2000  . Inguinal hernia repair Right 02/05/2013    Procedure: HERNIA REPAIR femoral ADULT;  Surgeon: Shann Medal, MD;  Location: [facility];  Service: General;  Laterality: Right;  with MESH   Social History  Substance Use Topics  . Smoking status: Former Smoker -- 1.00 packs/day for 10 years    Types: Cigarettes    Quit date: 04/24/1982  . Smokeless tobacco: Never Used  . Alcohol Use: Yes     Comment: 2 glass of wine per  day   Allergies  Allergen Reactions  . Other Other (See Comments)    Msg= cramps and diarrhea   Family History  Problem Relation Age of Onset  . Alzheimer's disease Mother   . Cancer Father     prostate        Past medical history, social, surgical and family history all reviewed in electronic medical record.   Review of Systems: No headache, visual changes, nausea, vomiting, diarrhea, constipation, dizziness, abdominal pain, skin rash, fevers, chills, night sweats, weight loss, swollen lymph nodes, body aches, joint swelling, muscle aches, chest pain, shortness of breath, mood changes.   Objective Blood pressure 126/72, pulse 86, height 5\' 7"  (1.702 m), weight 138 lb (62.596 kg), SpO2 95 %.  General: No apparent distress alert and oriented x3 mood and affect normal, dressed appropriately.  HEENT: Pupils equal, extraocular movements intact  Respiratory: Patient's speak in full sentences and does not appear short of breath  Cardiovascular: No lower extremity edema, non tender, no erythema  Skin: Warm dry intact with no signs of infection or rash on extremities or on axial skeleton.  Abdomen: Soft nontender  Neuro: Cranial nerves II through XII are intact, neurovascularly intact in all extremities with 2+ DTRs and 2+ pulses.  Lymph: No lymphadenopathy of posterior or anterior cervical chain or axillae bilaterally.  Gait normal  balance the patient does walk with a little bit of an external leg.  MSK:  Non tender with full range of motion and good stability and symmetric strength and tone of shoulders, elbows, wrist,  knee and ankles bilaterally.  Hip: Right ROM IR: 15 Deg less pain with internal rotation ER: 45 Deg with mild pain as well, Flexion: 120 Deg, Extension: 100 Deg, Abduction: 45 Deg, Adduction: 45 Deg Strength IR: 5/5, ER: 5/5, Flexion: 5/5 but does have pain on the right side compared to no pain on the left side but symmetric in strength, Extension: 5/5, Abduction: 5/5,  Adduction: 5/5 Pelvic alignment unremarkable to inspection and palpation. Standing hip rotation and gait without trendelenburg sign / unsteadiness. Minimal pain over the greater trochanteric area. No tenderness over piriformis  pain with FABER and FADIR. Patient also has pain with flexion of the hip unremarkable on the lateral aspect. No SI joint tenderness and normal minimal SI movement. Contralateral hip unremarkable. Questionable positive straight leg test on the right side this is new from previous exam   Procedure: Real-time Ultrasound Guided Injection of right greater trochanteric bursitis secondary to patient's body habitus Device: GE Logiq E  Ultrasound guided injection is preferred based studies that show increased duration, increased effect, greater accuracy, decreased procedural pain, increased response rate, and decreased cost with ultrasound guided versus blind injection.  Verbal informed consent obtained.  Time-out conducted.  Noted no overlying erythema, induration, or other signs of local infection.  Skin prepped in a sterile fashion.  Local anesthesia: Topical Ethyl chloride.  With sterile technique and under real time ultrasound guidance:  Greater trochanteric area was visualized and patient's bursa was noted. A 22-gauge 3 inch needle was inserted and 4 cc of 0.5% Marcaine and 1 cc of Kenalog 40 mg/dL was injected. Pictures taken Completed without difficulty  Pain immediately resolved suggesting accurate placement of the medication.  Advised to call if fevers/chills, erythema, induration, drainage, or persistent bleeding.  Images permanently stored and available for review in the ultrasound unit.  Impression: Technically successful ultrasound guided injection.   Impression and Recommendations:     This case required medical decision making of moderate complexity.

## 2015-05-11 ENCOUNTER — Telehealth: Payer: Self-pay

## 2015-05-12 ENCOUNTER — Other Ambulatory Visit: Payer: Self-pay | Admitting: Family Medicine

## 2015-05-19 ENCOUNTER — Ambulatory Visit (INDEPENDENT_AMBULATORY_CARE_PROVIDER_SITE_OTHER): Payer: BLUE CROSS/BLUE SHIELD | Admitting: Family Medicine

## 2015-05-19 ENCOUNTER — Encounter: Payer: Self-pay | Admitting: Family Medicine

## 2015-05-19 VITALS — BP 126/78 | HR 94 | Wt 136.0 lb

## 2015-05-19 DIAGNOSIS — M1611 Unilateral primary osteoarthritis, right hip: Secondary | ICD-10-CM

## 2015-05-19 DIAGNOSIS — M1990 Unspecified osteoarthritis, unspecified site: Secondary | ICD-10-CM

## 2015-05-19 DIAGNOSIS — M7061 Trochanteric bursitis, right hip: Secondary | ICD-10-CM

## 2015-05-19 DIAGNOSIS — M5431 Sciatica, right side: Secondary | ICD-10-CM

## 2015-05-19 DIAGNOSIS — M5136 Other intervertebral disc degeneration, lumbar region: Secondary | ICD-10-CM | POA: Diagnosis not present

## 2015-05-19 DIAGNOSIS — M51369 Other intervertebral disc degeneration, lumbar region without mention of lumbar back pain or lower extremity pain: Secondary | ICD-10-CM | POA: Insufficient documentation

## 2015-05-19 NOTE — Assessment & Plan Note (Signed)
Patient does have some sciatica. Very minimal amount still remaining. I do think patient's degenerative disc disease of the lumbar spine is quite severe likely will have some exacerbations from time to time. Patient will avoid any type of surgical intervention. Patient is going to remain active overall. Patient and will come back and see me again in 6 weeks or sooner if any worsening symptoms.

## 2015-05-19 NOTE — Progress Notes (Signed)
[facility] [address] [place], [place] [address] Phone: [phone] Subjective:    I'm seeing this patient by the request  of:  Redge Gainer, MD   CC: right leg pain f/u  [id number] Emily Doyle is a 70 y.o. female coming in with complaint of right leg pain. Patient was previously seen and did have some mild arthritic changes of the hip also is having what appeared to be more of a sciatica on the right side. Patient had some tenderness on the right greater trochanteric area. Patient elected try the injection on the lateral aspect of the hip. There is still a concern for patient having more radicular symptoms. X-ray of patient's back was independently visualized by me today. Patient did have severe degenerative disc disease and arthritic changes of the low back as well as osteopenia. Patient was to start on gabapentin 100 mg at night. Patient was to do range of motion and home exercises. Patient states overall patient is approximate 80% better.  Past Medical History  Diagnosis Date  . Tachycardia   . Diverticulosis   . History of colon polyps   . Migraine with aura   . Vitamin B 12 deficiency   . Complication of anesthesia     slow to wake up  . Other and unspecified hyperlipidemia   . HTN (hypertension), benign    Past Surgical History  Procedure Laterality Date  . Ablation for tachacardia  2012  . Breast cyst aspiration  2000  . Inguinal hernia repair Right 02/05/2013    Procedure: HERNIA REPAIR femoral ADULT;  Surgeon: Shann Medal, MD;  Location: [facility];  Service: General;  Laterality: Right;  with MESH   Social History  Substance Use Topics  . Smoking status: Former Smoker -- 1.00 packs/day for 10 years    Types: Cigarettes    Quit date: 04/24/1982  . Smokeless tobacco: Never Used  . Alcohol Use: Yes     Comment: 2 glass of wine per day   Allergies  Allergen Reactions  . Other Other (See Comments)    Msg= cramps and diarrhea    Family History  Problem Relation Age of Onset  . Alzheimer's disease Mother   . Cancer Father     prostate        Past medical history, social, surgical and family history all reviewed in electronic medical record.   Review of Systems: No headache, visual changes, nausea, vomiting, diarrhea, constipation, dizziness, abdominal pain, skin rash, fevers, chills, night sweats, weight loss, swollen lymph nodes, body aches, joint swelling, muscle aches, chest pain, shortness of breath, mood changes.   Objective Blood pressure 126/78, pulse 94, weight 136 lb (61.689 kg), SpO2 96 %.  General: No apparent distress alert and oriented x3 mood and affect normal, dressed appropriately.  HEENT: Pupils equal, extraocular movements intact  Respiratory: Patient's speak in full sentences and does not appear short of breath  Cardiovascular: No lower extremity edema, non tender, no erythema  Skin: Warm dry intact with no signs of infection or rash on extremities or on axial skeleton.  Abdomen: Soft nontender  Neuro: Cranial nerves II through XII are intact, neurovascularly intact in all extremities with 2+ DTRs and 2+ pulses.  Lymph: No lymphadenopathy of posterior or anterior cervical chain or axillae bilaterally.  Gait normal balance the patient does walk with a little bit of an external leg.  MSK:  Non tender with full range of motion and good stability and  symmetric strength and tone of shoulders, elbows, wrist,  knee and ankles bilaterally.  Hip: Right ROM IR: 15 Deg less pain with internal rotation ER: 45 Deg with mild pain as well, Flexion: 120 Deg, Extension: 100 Deg, Abduction: 45 Deg, Adduction: 45 Deg Strength IR: 5/5, ER: 5/5, Flexion: 5/5 but does have pain on the right side compared to no pain on the left side but symmetric in strength, Extension: 5/5, Abduction: 5/5, Adduction: 5/5 Pelvic alignment unremarkable to inspection and palpation. Standing hip rotation and gait without  trendelenburg sign / unsteadiness. Nontender over the greater trochanter. No tenderness over piriformis  pain with FABER and FADIR. Patient also has pain with flexion of the hip unremarkable on the lateral aspect. No SI joint tenderness and normal minimal SI movement. Contralateral hip unremarkable. Negative straight leg test     Impression and Recommendations:     This case required medical decision making of moderate complexity.

## 2015-05-19 NOTE — Assessment & Plan Note (Signed)
Improved after injection. Discuss that think is more of a compensation. We discussed that if any worsening symptoms we can repeat every 3-4 months. Patient will continue with topical anti-inflammatory and home exercises. Patient will come back and see me again in 6 weeks for further evaluation.

## 2015-05-19 NOTE — Assessment & Plan Note (Signed)
We'll continue to monitor closely. We discussed icing regimen. We discussed home exercises again. Patient will continue with the over-the-counter medications. Patient will start some strength conditioning. Come back and see me again in 6 weeks.

## 2015-05-19 NOTE — Patient Instructions (Signed)
iron 65mg  and 500mg  of vitamin C 3 times a week Continue the high dose vitamin D  I like the B vitamins Continue the gabapentin  Tart cherry extract at night Focus on the core OK to start with gym 2 times a week and my exercises 2-3 times a week and consider 1 day of yoga.  Ice is your friend when you need it.  If worsening symptoms see me  Otherwise see me again in 6 weeks.

## 2015-05-21 ENCOUNTER — Other Ambulatory Visit: Payer: Self-pay | Admitting: *Deleted

## 2015-05-21 MED ORDER — GABAPENTIN 100 MG PO CAPS: 200.0000 mg | ORAL_CAPSULE | Freq: Every day | ORAL | Status: DC

## 2015-05-21 NOTE — Telephone Encounter (Signed)
Refill done.  

## 2015-06-09 ENCOUNTER — Other Ambulatory Visit: Payer: Self-pay | Admitting: Family Medicine

## 2015-06-30 ENCOUNTER — Ambulatory Visit (INDEPENDENT_AMBULATORY_CARE_PROVIDER_SITE_OTHER): Payer: BLUE CROSS/BLUE SHIELD | Admitting: Family Medicine

## 2015-06-30 ENCOUNTER — Encounter: Payer: Self-pay | Admitting: Family Medicine

## 2015-06-30 VITALS — BP 128/76 | HR 78 | Ht 67.0 in | Wt 135.0 lb

## 2015-06-30 DIAGNOSIS — M5416 Radiculopathy, lumbar region: Secondary | ICD-10-CM | POA: Diagnosis not present

## 2015-06-30 NOTE — Patient Instructions (Signed)
Good to see  We will get MRI Depending on findings we will get epidural.  If we do epidural I want to see you again in 2 weeks after Continue the medications.

## 2015-06-30 NOTE — Progress Notes (Signed)
[facility] [address] [place], [place] [address] Phone: [phone] Subjective:    I'm seeing this patient by the request  of:  Redge Gainer, MD   CC: right leg pain f/u   [id number] Emily Doyle is a 69 y.o. female coming in with complaint of right leg pain. Patient was previously seen and did have some mild arthritic changes of the hip also is having what appeared to be more of a sciatica on the right side. Patient had some tenderness on the right greater trochanteric area. Patient elected try the injection on the lateral aspect of the hip. There is still a concern for patient having more radicular symptoms. X-ray of patient's back was independently visualized by me today. Patient did have severe degenerative disc disease and arthritic changes of the low back as well as osteopenia. Patient was to start on gabapentin 100 mg at night. Patient was doing better. Patient though states that now she is starting to have worsening symptoms again. States that sometimes her leg feels very heavy. Starting to decrease her activity. Patient states that sometimes it feels like her legs or any giving out on her. Still has some mild neck discomfort and hip discomfort. Denies any bowel or bladder incontinence. States that there is a tingling on the bottom of her foot that is new as well.  Past Medical History  Diagnosis Date  . Tachycardia   . Diverticulosis   . History of colon polyps   . Migraine with aura   . Vitamin B 12 deficiency   . Complication of anesthesia     slow to wake up  . Other and unspecified hyperlipidemia   . HTN (hypertension), benign    Past Surgical History  Procedure Laterality Date  . Ablation for tachacardia  2012  . Breast cyst aspiration  2000  . Inguinal hernia repair Right 02/05/2013    Procedure: HERNIA REPAIR femoral ADULT;  Surgeon: Shann Medal, MD;  Location: [facility];  Service: General;  Laterality: Right;  with MESH    Social History  Substance Use Topics  . Smoking status: Former Smoker -- 1.00 packs/day for 10 years    Types: Cigarettes    Quit date: 04/24/1982  . Smokeless tobacco: Never Used  . Alcohol Use: Yes     Comment: 2 glass of wine per day   Allergies  Allergen Reactions  . Other Other (See Comments)    Msg= cramps and diarrhea   Family History  Problem Relation Age of Onset  . Alzheimer's disease Mother   . Cancer Father     prostate        Past medical history, social, surgical and family history all reviewed in electronic medical record.   Review of Systems: No headache, visual changes, nausea, vomiting, diarrhea, constipation, dizziness, abdominal pain, skin rash, fevers, chills, night sweats, weight loss, swollen lymph nodes, body aches, joint swelling, muscle aches, chest pain, shortness of breath, mood changes.   Objective Blood pressure 128/76, pulse 78, height 5\' 7"  (1.702 m), weight 135 lb (61.236 kg), SpO2 96 %.  General: No apparent distress alert and oriented x3 mood and affect normal, dressed appropriately.  HEENT: Pupils equal, extraocular movements intact  Respiratory: Patient's speak in full sentences and does not appear short of breath  Cardiovascular: No lower extremity edema, non tender, no erythema  Skin: Warm dry intact with no signs of infection or rash on extremities or on axial skeleton.  Abdomen:  Soft nontender  Neuro: Cranial nerves II through XII are intact, Lymph: No lymphadenopathy of posterior or anterior cervical chain or axillae bilaterally.  Gait his compared to contralateral side with antalgic gait MSK:  Non tender with full range of motion and good stability and symmetric strength and tone of shoulders, elbows, wrist,  knee and ankles bilaterally.  Back Exam:  Inspection: Unremarkable  Motion: Flexion 35 deg, Extension 15 deg, Side Bending to 45 deg bilaterally,  Rotation to 45 deg bilaterally  SLR laying: positive on the right XSLR  laying: Negative  Palpable tenderness: tender over the paraspinal muscular trauma the L4-L5 on the right side FABER: negative. Sensory change: Gross sensation intact to all lumbar and sacral dermatomes.  Reflexes are 3+ patellar and 1+ Achilles on the right side Strength at foot  Plantar-flexion: 5/5 Dorsi-flexion: 5/5 Eversion: 5/5 Inversion: 5/5  Leg strength  Quad: 5/5 Hamstring: 5/5 Hip flexor: 5/5 Hip abductors: 5/5        Impression and Recommendations:     This case required medical decision making of moderate complexity.

## 2015-06-30 NOTE — Progress Notes (Signed)
Pre visit review using our clinic review tool, if applicable. No additional management support is needed unless otherwise documented below in the visit note. 

## 2015-06-30 NOTE — Assessment & Plan Note (Signed)
Patient is having more of the right lumbar radiculopathy. Patient's symptoms seem to be worsening quite acutely at this moment. Patient is having difficulty with daily activities and going to the store. Patient does have neurologic findings as well area at this point I do think that advance imaging is ordered. MRI patient's lumbar spine ordered today. We discussed icing regimen. We discussed continuing the gabapentin. Patient declined pain medications. If patient's MRI does show nerve root impingement that I would consider epidural or nerve root injections.  Spent  25 minutes with patient face-to-face and had greater than 50% of counseling including as described above in assessment and plan.

## 2015-06-30 NOTE — Addendum Note (Signed)
Addended by: Douglass Rivers T on: 06/30/2015 02:39 PM   Modules accepted: Orders

## 2015-07-12 ENCOUNTER — Ambulatory Visit
Admission: RE | Admit: 2015-07-12 | Discharge: 2015-07-12 | Disposition: A | Discharge status: Home / Self Care | Payer: BLUE CROSS/BLUE SHIELD | Source: Ambulatory Visit | Attending: Family Medicine | Admitting: Family Medicine

## 2015-07-12 DIAGNOSIS — M5416 Radiculopathy, lumbar region: Secondary | ICD-10-CM

## 2015-07-13 ENCOUNTER — Encounter: Payer: Self-pay | Admitting: Family Medicine

## 2015-07-14 ENCOUNTER — Other Ambulatory Visit: Payer: Self-pay

## 2015-07-14 DIAGNOSIS — M5416 Radiculopathy, lumbar region: Secondary | ICD-10-CM

## 2015-07-26 ENCOUNTER — Ambulatory Visit
Admission: RE | Admit: 2015-07-26 | Discharge: 2015-07-26 | Disposition: A | Discharge status: Home / Self Care | Payer: BLUE CROSS/BLUE SHIELD | Source: Ambulatory Visit | Attending: Family Medicine | Admitting: Family Medicine

## 2015-07-26 DIAGNOSIS — M5137 Other intervertebral disc degeneration, lumbosacral region: Secondary | ICD-10-CM | POA: Diagnosis not present

## 2015-07-26 DIAGNOSIS — M5416 Radiculopathy, lumbar region: Secondary | ICD-10-CM

## 2015-07-26 MED ORDER — METHYLPREDNISOLONE ACETATE 40 MG/ML INJ SUSP (RADIOLOG
120.0000 mg | Freq: Once | INTRAMUSCULAR | Status: AC
Start: 2015-07-26 — End: 2015-07-26
  Administered 2015-07-26: 120 mg via EPIDURAL

## 2015-07-26 MED ORDER — IOHEXOL 180 MG/ML  SOLN
1.0000 mL | Freq: Once | INTRAMUSCULAR | Status: AC | PRN
  Administered 2015-07-26: 1 mL via EPIDURAL

## 2015-07-26 NOTE — Discharge Instructions (Signed)

## 2015-08-05 ENCOUNTER — Ambulatory Visit: Payer: BLUE CROSS/BLUE SHIELD | Admitting: Family Medicine

## 2015-08-09 ENCOUNTER — Other Ambulatory Visit (INDEPENDENT_AMBULATORY_CARE_PROVIDER_SITE_OTHER): Payer: BLUE CROSS/BLUE SHIELD

## 2015-08-09 ENCOUNTER — Encounter: Payer: Self-pay | Admitting: Family Medicine

## 2015-08-09 ENCOUNTER — Ambulatory Visit (INDEPENDENT_AMBULATORY_CARE_PROVIDER_SITE_OTHER): Payer: BLUE CROSS/BLUE SHIELD | Admitting: Family Medicine

## 2015-08-09 VITALS — BP 122/82 | HR 72

## 2015-08-09 DIAGNOSIS — M7061 Trochanteric bursitis, right hip: Secondary | ICD-10-CM

## 2015-08-09 DIAGNOSIS — M5416 Radiculopathy, lumbar region: Secondary | ICD-10-CM | POA: Diagnosis not present

## 2015-08-09 DIAGNOSIS — M25551 Pain in right hip: Secondary | ICD-10-CM

## 2015-08-09 MED ORDER — GABAPENTIN 100 MG PO CAPS: 100.0000 mg | ORAL_CAPSULE | Freq: Four times a day (QID) | ORAL | Status: DC

## 2015-08-09 NOTE — Assessment & Plan Note (Signed)
Patient was injected again today. Discussed with her that I do not want to make discontinued chain an option with it likely be more of a lumbar radiculopathy. We discussed icing regimen. Discussed home exercises in the hip abductors. We discussed diet changes. Patient will continue to try to make these changes and come back and see me again in 4 weeks.

## 2015-08-09 NOTE — Patient Instructions (Signed)
Good to see you  I am sorry it is not gone, and we will keep trying Injected the hip to see if it helps Ice 20 minutes 2 times daily. Usually after activity and before bed. Gabapentin 100mg  in AM, 100mg  in PM, and 100-200mg  at night If not better in 4 weeks send me a message and we will try an epidural at L4-5

## 2015-08-09 NOTE — Assessment & Plan Note (Addendum)
Patient's radicular symptoms still seemed to be related to an L4-L5 right-sided compression. Did have good resolution of pain with the epidural. I do think that conservative a transforaminal an epidural at L4-L5 could be beneficial. Patient does not have improvement in 4 weeks over 5 to do the epidural injection.increase patient's gabapentin.

## 2015-08-09 NOTE — Progress Notes (Signed)
[facility] [address] [place], [place] [address] Phone: [phone] Subjective:    I'm seeing this patient by the request  of:  Redge Gainer, MD   CC: right leg pain f/u   [id number] Emily Doyle is a 70 y.o. female coming in with complaint of right leg pain. Patient was previously seen and did have some mild arthritic changes of the hip also is having what appeared to be more of a sciatica on the right side. Patient had some tenderness on the right greater trochanteric area. Patient elected try the injection on the lateral aspect of the hip. Patient was found to have severe arthritic changes of the back. MRI showed a right L4-L5 nerve root impingement and did have a transforaminal epidural at L5-S1 2 weeks ago. Patient states she did have near complete resolution of pain immediately for proximally 2 hours after the injection. Pain has slowly started coming back. Seems to be worsening over that time. Patient states it radiates past her knee. Sometimes goes to her foot. States that he continues to have a dull aching sensation almost all the time. Gabapentin does help her sleep. No increase weakness or numbness and denies any groin pain.patient feels that the worsening pain seems to be more localized to lateral hip such as like the bursitis she's had previously.  Past Medical History  Diagnosis Date  . Tachycardia   . Diverticulosis   . History of colon polyps   . Migraine with aura   . Vitamin B 12 deficiency   . Complication of anesthesia     slow to wake up  . Other and unspecified hyperlipidemia   . HTN (hypertension), benign    Past Surgical History  Procedure Laterality Date  . Ablation for tachacardia  2012  . Breast cyst aspiration  2000  . Inguinal hernia repair Right 02/05/2013    Procedure: HERNIA REPAIR femoral ADULT;  Surgeon: Shann Medal, MD;  Location: [facility];  Service: General;  Laterality: Right;  with MESH   Social History    Substance Use Topics  . Smoking status: Former Smoker -- 1.00 packs/day for 10 years    Types: Cigarettes    Quit date: 04/24/1982  . Smokeless tobacco: Never Used  . Alcohol Use: Yes     Comment: 2 glass of wine per day   Allergies  Allergen Reactions  . Other Other (See Comments)    Msg= cramps and diarrhea   Family History  Problem Relation Age of Onset  . Alzheimer's disease Mother   . Cancer Father     prostate        Past medical history, social, surgical and family history all reviewed in electronic medical record.   Review of Systems: No headache, visual changes, nausea, vomiting, diarrhea, constipation, dizziness, abdominal pain, skin rash, fevers, chills, night sweats, weight loss, swollen lymph nodes, body aches, joint swelling, muscle aches, chest pain, shortness of breath, mood changes.   Objective Blood pressure 122/82, pulse 72.  General: No apparent distress alert and oriented x3 mood and affect normal, dressed appropriately.  HEENT: Pupils equal, extraocular movements intact  Respiratory: Patient's speak in full sentences and does not appear short of breath  Cardiovascular: No lower extremity edema, non tender, no erythema  Skin: Warm dry intact with no signs of infection or rash on extremities or on axial skeleton.  Abdomen: Soft nontender  Neuro: Cranial nerves II through XII are intact, Lymph: No lymphadenopathy of  posterior or anterior cervical chain or axillae bilaterally.  Gait his compared to contralateral side with antalgic gait MSK:  Non tender with full range of motion and good stability and symmetric strength and tone of shoulders, elbows, wrist,  knee and ankles bilaterally.  Back Exam:  Inspection: Unremarkable  Motion: Flexion 35 deg, Extension 15 deg, Side Bending to 45 deg bilaterally,  Rotation to 45 deg bilaterally  SLR laying: positive on the right XSLR laying: Negative  Palpable tenderness: severely tender to palpation over the  greater trochanteric area FABER: negative. Sensory change: Gross sensation intact to all lumbar and sacral dermatomes.  Reflexes are 3+ patellar and 1+ Achilles on the right side Strength at foot  Plantar-flexion: 5/5 Dorsi-flexion: 5/5 Eversion: 5/5 Inversion: 5/5  Leg strength  Quad: 5/5 Hamstring: 5/5 Hip flexor: 5/5 Hip abductors: 4/5  Mild worsening of previous exam Procedure: Real-time Ultrasound Guided Injection of right greater trochanteric bursitis secondary to patient's body habitus Device: GE Logiq E  Ultrasound guided injection is preferred based studies that show increased duration, increased effect, greater accuracy, decreased procedural pain, increased response rate, and decreased cost with ultrasound guided versus blind injection.  Verbal informed consent obtained.  Time-out conducted.  Noted no overlying erythema, induration, or other signs of local infection.  Skin prepped in a sterile fashion.  Local anesthesia: Topical Ethyl chloride.  With sterile technique and under real time ultrasound guidance:  Greater trochanteric area was visualized and patient's bursa was noted. A 22-gauge 3 inch needle was inserted and 4 cc of 0.5% Marcaine and 1 cc of Kenalog 40 mg/dL was injected. Pictures taken Completed without difficulty  Pain immediately resolved suggesting accurate placement of the medication.  Advised to call if fevers/chills, erythema, induration, drainage, or persistent bleeding.  Images permanently stored and available for review in the ultrasound unit.  Impression: Technically successful ultrasound guided injection.     Impression and Recommendations:     This case required medical decision making of moderate complexity.

## 2015-08-10 ENCOUNTER — Ambulatory Visit: Payer: BLUE CROSS/BLUE SHIELD | Admitting: Family Medicine

## 2015-08-11 DIAGNOSIS — H52223 Regular astigmatism, bilateral: Secondary | ICD-10-CM | POA: Diagnosis not present

## 2015-08-11 DIAGNOSIS — H5203 Hypermetropia, bilateral: Secondary | ICD-10-CM | POA: Diagnosis not present

## 2015-08-17 ENCOUNTER — Other Ambulatory Visit: Payer: Self-pay | Admitting: Family Medicine

## 2015-08-23 DIAGNOSIS — T7840XA Allergy, unspecified, initial encounter: Secondary | ICD-10-CM | POA: Diagnosis not present

## 2015-08-23 DIAGNOSIS — R202 Paresthesia of skin: Secondary | ICD-10-CM | POA: Diagnosis not present

## 2015-08-23 DIAGNOSIS — I10 Essential (primary) hypertension: Secondary | ICD-10-CM | POA: Diagnosis not present

## 2015-08-23 DIAGNOSIS — R220 Localized swelling, mass and lump, head: Secondary | ICD-10-CM | POA: Diagnosis not present

## 2015-08-23 DIAGNOSIS — Z79899 Other long term (current) drug therapy: Secondary | ICD-10-CM | POA: Diagnosis not present

## 2015-08-23 DIAGNOSIS — R221 Localized swelling, mass and lump, neck: Secondary | ICD-10-CM | POA: Diagnosis not present

## 2015-08-23 DIAGNOSIS — R200 Anesthesia of skin: Secondary | ICD-10-CM | POA: Diagnosis not present

## 2015-10-19 ENCOUNTER — Encounter: Payer: Self-pay | Admitting: Family Medicine

## 2015-10-19 ENCOUNTER — Ambulatory Visit (INDEPENDENT_AMBULATORY_CARE_PROVIDER_SITE_OTHER): Payer: BLUE CROSS/BLUE SHIELD | Admitting: Family Medicine

## 2015-10-19 VITALS — BP 127/76 | HR 84 | Temp 98.4°F | Ht 67.0 in | Wt 131.8 lb

## 2015-10-19 DIAGNOSIS — D692 Other nonthrombocytopenic purpura: Secondary | ICD-10-CM | POA: Diagnosis not present

## 2015-10-19 DIAGNOSIS — R233 Spontaneous ecchymoses: Secondary | ICD-10-CM

## 2015-10-19 DIAGNOSIS — R238 Other skin changes: Secondary | ICD-10-CM | POA: Diagnosis not present

## 2015-10-19 DIAGNOSIS — D539 Nutritional anemia, unspecified: Secondary | ICD-10-CM | POA: Diagnosis not present

## 2015-10-19 NOTE — Patient Instructions (Signed)
Great to see you!  We will call as soon as we can get these results back.   For now try to reduce using your NSAIDs like advil or aleve

## 2015-10-19 NOTE — Progress Notes (Signed)
   HPI  Patient presents today here with recent onset of easy bruising.  Patient explains that over the last 1 week she developed several bruises that are concerning.  Today she has development of bruise over her right eye. She states that she has not had any trauma. She has no discomfort of the eye.   Arm bruises She's developed dark purple arm bruises with very little trauma over the last week. In one such incident she bumped her arm while making her bed. She had one that she placed a bandage over whenever she removed the Band-Aid she ripped the skin slightly.   She takes aspirin every day for primary stroke prevention.  PMH: Smoking status noted ROS: Per HPI  Objective: BP 127/76 mmHg  Pulse 84  Temp(Src) 98.4 F (36.9 C) (Oral)  Ht '5\' 7"'$  (1.702 m)  Wt 131 lb 12.8 oz (59.784 kg)  BMI 20.64 kg/m2 Gen: NAD, alert, cooperative with exam HEENT: NCAT CV: RRR, good S1/S2, no murmur Resp: CTABL, no wheezes, non-labored Ext: No edema, warm Neuro: Alert and oriented, No gross deficits  Skin Approximately 0.5 cm x 1 cm purple flat bruise overlying the central area of the zygomatic process of the right eye 2 areas on the right arm and one area on the left forearm, all in sun exposed areas with similar appearance, irregular, dark purple flat bruises that are nontender to palpation approximately 4 cm x 3 cm  Assessment and plan:  # Easy bruising Likely multifactorial given aspirin use, recent increase in steroid use from injections for pain, increased NSAID use, and chronic sun exposure. Labs drawn today Reduce NSAIDs Consider stopping aspirin  # Solar purpura Discussed that her arm lesions are most likely solar purpura. She'll probably have the best benefit from stopping aspirin, however we will watch carefully for a time to see if this becomes a pattern. Again, consider stopping aspirin  Macrocytic anemia Recheck in folate and B-12 She has good medication  compliance.    Orders Placed This Encounter  Procedures  . Folate  . Vitamin B12  . CBC with Differential  . CMP14+EGFR  . Pathologist smear review  . Protime-INR  . APTT  . [place], MD [facility] 10/19/2015, 5:00 PM

## 2015-10-20 LAB — IRON AND TIBC
IRON SATURATION: 39 % (ref 15–55)
IRON: 107 ug/dL (ref 27–139)
TIBC: 271 ug/dL (ref 250–450)
UIBC: 164 ug/dL (ref 118–369)

## 2015-10-20 LAB — CBC WITH DIFFERENTIAL/PLATELET
Basophils Absolute: 0 10*3/uL (ref 0.0–0.2)
Basos: 1 %
EOS (ABSOLUTE): 0.1 10*3/uL (ref 0.0–0.4)
EOS: 2 %
HEMATOCRIT: 30.9 % — AB (ref 34.0–46.6)
Hemoglobin: 10.5 g/dL — ABNORMAL LOW (ref 11.1–15.9)
Immature Grans (Abs): 0 10*3/uL (ref 0.0–0.1)
Immature Granulocytes: 1 %
LYMPHS ABS: 1.4 10*3/uL (ref 0.7–3.1)
Lymphs: 33 %
MCH: 36.6 pg — AB (ref 26.6–33.0)
MCHC: 34 g/dL (ref 31.5–35.7)
MCV: 108 fL — ABNORMAL HIGH (ref 79–97)
MONOCYTES: 9 %
Monocytes Absolute: 0.4 10*3/uL (ref 0.1–0.9)
NEUTROS ABS: 2.2 10*3/uL (ref 1.4–7.0)
Neutrophils: 54 %
Platelets: 391 10*3/uL — ABNORMAL HIGH (ref 150–379)
RBC: 2.87 x10E6/uL — ABNORMAL LOW (ref 3.77–5.28)
RDW: 17.1 % — ABNORMAL HIGH (ref 12.3–15.4)
WBC: 4.1 10*3/uL (ref 3.4–10.8)

## 2015-10-20 LAB — CMP14+EGFR
ALBUMIN: 4.5 g/dL (ref 3.5–4.8)
ALK PHOS: 64 IU/L (ref 39–117)
ALT: 19 IU/L (ref 0–32)
AST: 20 IU/L (ref 0–40)
Albumin/Globulin Ratio: 2.1 (ref 1.2–2.2)
BILIRUBIN TOTAL: 0.3 mg/dL (ref 0.0–1.2)
BUN / CREAT RATIO: 25 (ref 12–28)
BUN: 14 mg/dL (ref 8–27)
CO2: 22 mmol/L (ref 18–29)
CREATININE: 0.57 mg/dL (ref 0.57–1.00)
Calcium: 9.5 mg/dL (ref 8.7–10.3)
Chloride: 103 mmol/L (ref 96–106)
GFR calc Af Amer: 109 mL/min/{1.73_m2} (ref >59)
GFR calc non Af Amer: 94 mL/min/{1.73_m2} (ref >59)
GLOBULIN, TOTAL: 2.1 g/dL (ref 1.5–4.5)
Glucose: 87 mg/dL (ref 65–99)
Potassium: 4 mmol/L (ref 3.5–5.2)
SODIUM: 143 mmol/L (ref 134–144)
Total Protein: 6.6 g/dL (ref 6.0–8.5)

## 2015-10-20 LAB — APTT: APTT: 26 s (ref 24–33)

## 2015-10-20 LAB — VITAMIN B12: Vitamin B-12: 728 pg/mL (ref 211–946)

## 2015-10-20 LAB — FOLATE: Folate: 7 ng/mL (ref >3.0)

## 2015-10-20 LAB — PROTIME-INR
INR: 1 (ref 0.8–1.2)
Prothrombin Time: 10.1 s (ref 9.1–12.0)

## 2015-10-20 LAB — FERRITIN: Ferritin: 201 ng/mL — ABNORMAL HIGH (ref 15–150)

## 2015-10-21 LAB — PATHOLOGIST SMEAR REVIEW
PATH REV WBC: NORMAL
Path Rev PLTs: NORMAL

## 2015-10-22 ENCOUNTER — Other Ambulatory Visit: Payer: Self-pay | Admitting: Family Medicine

## 2015-10-29 ENCOUNTER — Encounter: Payer: Self-pay | Admitting: Family Medicine

## 2015-11-09 ENCOUNTER — Encounter: Payer: Self-pay | Admitting: Family Medicine

## 2015-11-24 ENCOUNTER — Other Ambulatory Visit: Payer: Self-pay | Admitting: Family Medicine

## 2015-12-28 ENCOUNTER — Ambulatory Visit (INDEPENDENT_AMBULATORY_CARE_PROVIDER_SITE_OTHER): Payer: BLUE CROSS/BLUE SHIELD | Admitting: Family Medicine

## 2015-12-28 ENCOUNTER — Encounter: Payer: Self-pay | Admitting: Family Medicine

## 2015-12-28 VITALS — BP 119/78 | HR 84 | Temp 98.0°F | Ht 67.0 in | Wt 133.8 lb

## 2015-12-28 DIAGNOSIS — D539 Nutritional anemia, unspecified: Secondary | ICD-10-CM | POA: Insufficient documentation

## 2015-12-28 DIAGNOSIS — H538 Other visual disturbances: Secondary | ICD-10-CM | POA: Diagnosis not present

## 2015-12-28 DIAGNOSIS — R208 Other disturbances of skin sensation: Secondary | ICD-10-CM

## 2015-12-28 DIAGNOSIS — R200 Anesthesia of skin: Secondary | ICD-10-CM

## 2015-12-28 NOTE — Patient Instructions (Signed)
Great to see you!  We will work on a CT scan for you as well as an allergy referral.   Please don't hesitate to let us know if anything else happens.

## 2015-12-28 NOTE — Progress Notes (Signed)
   HPI  Patient presents today here with facial numbness and blurred vision.  Patient explains that she's had episodes of facial numbness lasting several minutes similar to previous episode that caused her to go to the hospital. A few months ago she went to [facility] for facial numbness and tongue swelling. She was treated for allergic reaction and had no additional symptoms that she did not pursue further evaluation.  Over the last week she's had recurrent episodes of ascending numbness that started her bilateral clavicles and ascend up to her bilateral temple areas, she is not having the tongue swelling of this time.  Today she had an episode of blurred vision, described as wavy abnormality in the vision on the far left part of her vision. This resolved after a few minutes. She has had no new floaters. She also describes that this morning she was reading on the Internet when several words disappeared sporadically throughout the page. This has since also resolved.  PMH: Smoking status noted ROS: Per HPI   Objective: BP 119/78   Pulse 84   Temp 98 F (36.7 C) (Oral)   Ht 5\' 7"  (1.702 m)   Wt 133 lb 12.8 oz (60.7 kg)   BMI 20.96 kg/m  Gen: NAD, alert, cooperative with exam HEENT: NCAT, EOMI, PERRL CV: RRR, good S1/S2, no murmur Resp: CTABL, no wheezes, non-labored Ext: No edema, warm Neuro: Alert and oriented, strength 5/5 and sensation intact in all 4 extremities, cranial nerves II through XII intact, no overt sign or pronator drift. Normal gait.  Assessment and plan:  # Facial numbness  interesting presentation with ascending warmth and numbness sensation with no clear etiology. Previously with tongue swelling which drove her to be evaluated at the emergency room. No clear allergy, referring to allergy.  # Vision abnormality and blurred vision In conjunction with her other symptoms of facial numbness I think it's most prudent to go ahead and rule out occult CVA. MRI  brain ordered Reassurance provided about her visual abnormalities as they have resolved at this time. Low threshold for return Continue aspirin and statin  # Macrocytic anemia Rechecking CBC, B-12 and folate level as well.     Orders Placed This Encounter  Procedures  . MR Brain Wo Contrast    Standing Status:   Future    Standing Expiration Date:   02/26/2017    Order Specific Question:   Reason for Exam (SYMPTOM  OR DIAGNOSIS REQUIRED)    Answer:   Blurred vision, facial numbness, r/o occult stroke    Order Specific Question:   Preferred imaging location?    Answer:   [facility] (table limit-550lbs)    Order Specific Question:   What is the patient's sedation requirement?    Answer:   No Sedation    Order Specific Question:   Does the patient have a pacemaker or implanted devices?    Answer:   No  . Vitamin B12  . Folate  . CBC with Differential  . Ambulatory referral to Allergy    Referral Priority:   Routine    Referral Type:   Allergy Testing    Referral Reason:   Specialty Services Required    Requested Specialty:   Allergy    Number of Visits Requested:   [place], MD [facility] 12/28/2015, 3:26 PM

## 2015-12-29 LAB — CBC WITH DIFFERENTIAL/PLATELET
BASOS: 1 %
Basophils Absolute: 0 10*3/uL (ref 0.0–0.2)
EOS (ABSOLUTE): 0.1 10*3/uL (ref 0.0–0.4)
EOS: 2 %
HEMATOCRIT: 31.6 % — AB (ref 34.0–46.6)
Hemoglobin: 10.5 g/dL — ABNORMAL LOW (ref 11.1–15.9)
IMMATURE GRANS (ABS): 0 10*3/uL (ref 0.0–0.1)
IMMATURE GRANULOCYTES: 0 %
Lymphocytes Absolute: 1.4 10*3/uL (ref 0.7–3.1)
Lymphs: 34 %
MCH: 35.8 pg — AB (ref 26.6–33.0)
MCHC: 33.2 g/dL (ref 31.5–35.7)
MCV: 108 fL — AB (ref 79–97)
MONOS ABS: 0.4 10*3/uL (ref 0.1–0.9)
Monocytes: 9 %
NEUTROS ABS: 2.2 10*3/uL (ref 1.4–7.0)
NEUTROS PCT: 54 %
PLATELETS: 367 10*3/uL (ref 150–379)
RBC: 2.93 x10E6/uL — ABNORMAL LOW (ref 3.77–5.28)
RDW: 16.6 % — AB (ref 12.3–15.4)
WBC: 4.2 10*3/uL (ref 3.4–10.8)

## 2015-12-29 LAB — FOLATE: Folate: 7.2 ng/mL (ref >3.0)

## 2015-12-29 LAB — VITAMIN B12: Vitamin B-12: 1053 pg/mL — ABNORMAL HIGH (ref 211–946)

## 2016-01-07 ENCOUNTER — Ambulatory Visit
Admission: RE | Admit: 2016-01-07 | Discharge: 2016-01-07 | Disposition: A | Discharge status: Home / Self Care | Payer: BLUE CROSS/BLUE SHIELD | Source: Ambulatory Visit | Attending: Family Medicine | Admitting: Family Medicine

## 2016-01-07 DIAGNOSIS — H538 Other visual disturbances: Secondary | ICD-10-CM

## 2016-01-07 DIAGNOSIS — R200 Anesthesia of skin: Secondary | ICD-10-CM

## 2016-01-10 ENCOUNTER — Telehealth: Payer: Self-pay | Admitting: Family Medicine

## 2016-01-10 NOTE — Telephone Encounter (Signed)
Called and discussed - normal MRI brain.   Laroy Apple, MD [facility] 01/10/2016, 3:53 PM

## 2016-02-16 ENCOUNTER — Ambulatory Visit (INDEPENDENT_AMBULATORY_CARE_PROVIDER_SITE_OTHER): Payer: BLUE CROSS/BLUE SHIELD | Admitting: Allergy and Immunology

## 2016-02-16 ENCOUNTER — Encounter: Payer: Self-pay | Admitting: Allergy and Immunology

## 2016-02-16 VITALS — BP 130/90 | HR 90 | Temp 99.2°F | Resp 18 | Ht 65.0 in | Wt 138.2 lb

## 2016-02-16 DIAGNOSIS — J3089 Other allergic rhinitis: Secondary | ICD-10-CM

## 2016-02-16 DIAGNOSIS — Z79899 Other long term (current) drug therapy: Secondary | ICD-10-CM

## 2016-02-16 DIAGNOSIS — J301 Allergic rhinitis due to pollen: Secondary | ICD-10-CM

## 2016-02-16 DIAGNOSIS — T7840XA Allergy, unspecified, initial encounter: Secondary | ICD-10-CM

## 2016-02-16 MED ORDER — IPRATROPIUM BROMIDE 0.06 % NA SOLN: 2.0000 | Freq: Two times a day (BID) | NASAL | 5 refills | Status: DC

## 2016-02-16 MED ORDER — EPINEPHRINE 0.3 MG/0.3ML IJ SOAJ: 0.3000 mg | Freq: Once | INTRAMUSCULAR | 0 refills | Status: AC

## 2016-02-16 MED ORDER — LOSARTAN POTASSIUM 50 MG PO TABS: 50.0000 mg | ORAL_TABLET | Freq: Every day | ORAL | 3 refills | Status: DC

## 2016-02-16 NOTE — Patient Instructions (Addendum)
  1. Allergen avoidance measures?  2. EpiPen, Benadryl, M.D./ER for allergic reaction  3. Change lisinopril to losartan 50 mg once a day. Check BP  4. For nasal issue can try the following if needed:   A. OTC antihistamine - Claritin/Zyrtec/Allegra  C. nasal ipratropium 0.6% 2 sprays each nostril every 6 hours  5. Can attempt to discontinue Singulair  6. Obtain fall flu vaccine  7. Further evaluation?  8. Contact clinic if reactions or problems

## 2016-02-16 NOTE — Progress Notes (Signed)
Dear Dr. Wendi Doyle,  Thank you for referring Emily Doyle to the [facility] on 02/16/2016.   Below is a summation of this patient's evaluation and recommendations.  Thank you for your referral. I will keep you informed about this patient's response to treatment.   If you have any questions please to do hesitate to contact me.   Sincerely,  Emily Prows, MD [place]   ______________________________________________________________________    NEW PATIENT NOTE  Referring Provider: Timmothy Euler, MD Primary Provider: Redge Gainer, MD Date of office visit: 02/16/2016    Subjective:   Chief Complaint:  Emily Doyle (DOB: [birth year]) is a 70 y.o. female who presents to the clinic on 02/16/2016 with a chief complaint of Allergies (Req blood test instead of skin test) .     HPI: Emily Doyle presents to this clinic in evaluation of a apparent allergic reaction that occurred approximately 6 weeks ago. She was sitting down in her house at around 5 PM in the afternoon and eating some blue cheese and drinking some V8 and developed this tingling sensation on her neck and face without any specific dermatitis and then developed "throat closing" with some difficulty swallowing although her voice was not affected. She went to the urgent care center where her blood pressure was extremely high and she was transferred to the emergency room where she had evaluation that was nondiagnostic for her episode. She states that she has had several of these tingling neck episodes over the course of the past year. There is no obvious provoking factor giving rise to these reactions. There is no associated systemic or constitutional symptoms. It should be noted that she uses lisinopril daily.  She also has a little bit of cough in the morning. She'll have a spell of cough that last less than 2 minutes. It is not  associated with chest tightness or shortness of breath or sputum production or throat clearing or raspy voice. However, she has noticed that the tone of her voice has become deeper over the course of the past several years. She does not have any reflux symptoms. Apparently 5 years ago she developed a "stuffy head" and was started on Singulair and has not had any problem with her head since that point in time. She does have a constant runny nose and she also feels as though her nose is dripping. She can smell and taste without any difficulty at all and does not have a history of recurrent headaches.  Past Medical History:  Diagnosis Date  . Complication of anesthesia    slow to wake up  . Diverticulosis   . History of colon polyps   . HTN (hypertension), benign   . Migraine with aura   . Other and unspecified hyperlipidemia   . Tachycardia   . Vitamin B 12 deficiency     Past Surgical History:  Procedure Laterality Date  . ablation for tachacardia  2012  . BREAST CYST ASPIRATION  2000  . INGUINAL HERNIA REPAIR Right 02/05/2013   Procedure: HERNIA REPAIR femoral ADULT;  Surgeon: Shann Medal, MD;  Location: [facility];  Service: General;  Laterality: Right;  with MESH      Medication List      ADVIL PM PO Take 1 tablet by mouth at bedtime.   aspirin 81 MG EC tablet Take 81 mg by mouth daily.   cyanocobalamin 1000 MCG/ML  injection Commonly known as:  (VITAMIN B-12) INJECT 1 ML (1000MG ) weekly for 4 weeks and then resume monthly   cyanocobalamin 1000 MCG/ML injection Commonly known as:  (VITAMIN B-12) INJECT 1 ML (1000MG ) ONCE A MONTH AS DIRECTED   cyanocobalamin 1000 MCG/ML injection Commonly known as:  (VITAMIN B-12) INJECT 1 ML (1000MG ) ONCE A MONTH AS DIRECTED   gabapentin 100 MG capsule Commonly known as:  NEURONTIN Take 1 capsule (100 mg total) by mouth 4 (four) times daily.   HYDROcodone-acetaminophen 5-325 MG tablet Commonly known as:  NORCO Take 1 tablet by mouth  every 6 (six) hours as needed for moderate pain.   lisinopril 10 MG tablet Commonly known as:  PRINIVIL,ZESTRIL Take 1 tablet (10 mg total) by mouth daily.   montelukast 10 MG tablet Commonly known as:  SINGULAIR Take 1 tablet (10 mg total) by mouth daily.   multivitamin with minerals Tabs tablet Take 1 tablet by mouth daily.   pravastatin 40 MG tablet Commonly known as:  PRAVACHOL TAKE 1 & 1/2 TABLET AT BEDTIME   Vitamin D3 5000 units Tabs Take 5,000 Units by mouth daily.       Allergies  Allergen Reactions  . Other Other (See Comments)    Msg= cramps and diarrhea    Review of systems negative except as noted in HPI / PMHx or noted below:  Review of Systems  Constitutional: Negative.   HENT: Negative.   Eyes: Negative.   Respiratory: Negative.   Cardiovascular: Negative.   Gastrointestinal: Negative.   Genitourinary: Negative.   Musculoskeletal: Negative.   Skin: Negative.   Neurological: Negative.   Endo/Heme/Allergies: Negative.   Psychiatric/Behavioral: Negative.     Family History  Problem Relation Age of Onset  . Alzheimer's disease Mother   . Allergic rhinitis Mother   . Cancer Father     prostate  . Allergic rhinitis Father   . Allergic rhinitis Sister     Social History   Social History  . Marital status: Married    Spouse name: N/A  . Number of children: N/A  . Years of education: N/A   Occupational History  . Not on file.   Social History Main Topics  . Smoking status: Former Smoker    Packs/day: 1.00    Years: 10.00    Types: Cigarettes    Quit date: 04/24/1982  . Smokeless tobacco: Never Used  . Alcohol use Yes     Comment: 2 glass of wine per day  . Drug use: No  . Sexual activity: Yes    Partners: Male    Birth control/ protection: Post-menopausal   Other Topics Concern  . Not on file   Social History Narrative  . No narrative on file    Environmental and Social history  Lives in a house with a dry environment, a  dog located inside the household, carpeting in the bedroom, no plastic on the better pillow.  Objective:   Vitals:   02/16/16 0935  BP: 130/90  Pulse: 90  Resp: 18  Temp: 99.2 F (37.3 C)   Height: 5\' 5"  (165.1 cm) Weight: 138 lb 3.2 oz (62.7 kg)  Physical Exam  Constitutional: She is well-developed, well-nourished, and in no distress.  HENT:  Head: Normocephalic. Head is without right periorbital erythema and without left periorbital erythema.  Right Ear: Tympanic membrane, external ear and ear canal normal.  Left Ear: Tympanic membrane, external ear and ear canal normal.  Nose: Nose normal. No mucosal edema or rhinorrhea.  Mouth/Throat: Oropharynx is clear and moist and mucous membranes are normal. No oropharyngeal exudate.  Eyes: Conjunctivae and lids are normal. Pupils are equal, round, and reactive to light.  Neck: Trachea normal. No tracheal deviation present. No thyromegaly present.  Cardiovascular: Normal rate, regular rhythm, S1 normal, S2 normal and normal heart sounds.   No murmur heard. Pulmonary/Chest: Effort normal. No stridor. No tachypnea. No respiratory distress. She has no wheezes. She has no rales. She exhibits no tenderness.  Abdominal: Soft. She exhibits no distension and no mass. There is no hepatosplenomegaly. There is no tenderness. There is no rebound and no guarding.  Musculoskeletal: She exhibits no edema or tenderness.  Lymphadenopathy:       Head (right side): No tonsillar adenopathy present.       Head (left side): No tonsillar adenopathy present.    She has no cervical adenopathy.    She has no axillary adenopathy.  Neurological: She is alert. Gait normal.  Skin: No rash noted. She is not diaphoretic. No erythema. No pallor. Nails show no clubbing.  Psychiatric: Mood and affect normal.    Diagnostics: Allergy skin tests were performed. She demonstrated hypersensitivity against grasses and house dust mite.   Assessment and Plan:    1.  Allergic reaction, initial encounter   2. Other allergic rhinitis   3. On angiotensin-converting enzyme (ACE) inhibitors     1. Allergen avoidance measures?  2. EpiPen, Benadryl, M.D./ER for allergic reaction  3. Change lisinopril to losartan 50 mg once a day. Check BP  4. For nasal issue can try the following if needed:   A. OTC antihistamine - Claritin/Zyrtec/Allegra  C. nasal ipratropium 0.6% 2 sprays each nostril every 6 hours  5. Can attempt to discontinue Singulair  6. Obtain fall flu vaccine  7. Further evaluation?  8. Contact clinic if reactions or problems  Emily Doyle very well could have had an allergic reaction with unknown etiologic factor but given the fact that she had the sensation of throat closing with some difficulty swallowing I think she needs to have her lisinopril removed as this could certainly be responsible for her reaction. I will change her to losartan today and she can follow-up with her primary care doctor concerning further management of her blood pressure issue. Certainly if she has recurrent reactions as we move forward she is going to require further evaluation and treatment regarding this issue. To be on the safe side I did provide her a epinephrine autoinjector. She will need to determine whether or not she actually needs Singulair for her event that occurred 5 years ago involving her upper airways. For her rhinorrhea she can try an over-the-counter antihistamine or nasal ipratropium as needed. If she does not respond adequately to this approach then she can try nasal steroid. She will keep in contact with me noting her response as she moves forward.   Emily Prows, MD [facility]

## 2016-02-25 ENCOUNTER — Other Ambulatory Visit: Payer: Self-pay | Admitting: Family Medicine

## 2016-03-08 ENCOUNTER — Ambulatory Visit: Payer: BLUE CROSS/BLUE SHIELD | Admitting: Family Medicine

## 2016-04-04 DIAGNOSIS — M7742 Metatarsalgia, left foot: Secondary | ICD-10-CM | POA: Diagnosis not present

## 2016-04-04 DIAGNOSIS — G5792 Unspecified mononeuropathy of left lower limb: Secondary | ICD-10-CM | POA: Diagnosis not present

## 2016-04-05 ENCOUNTER — Ambulatory Visit (INDEPENDENT_AMBULATORY_CARE_PROVIDER_SITE_OTHER): Payer: BLUE CROSS/BLUE SHIELD | Admitting: Family Medicine

## 2016-04-05 ENCOUNTER — Encounter: Payer: Self-pay | Admitting: Family Medicine

## 2016-04-05 VITALS — BP 141/86 | HR 84 | Temp 98.0°F | Ht 65.0 in | Wt 137.0 lb

## 2016-04-05 DIAGNOSIS — F5101 Primary insomnia: Secondary | ICD-10-CM

## 2016-04-05 DIAGNOSIS — Z0000 Encounter for general adult medical examination without abnormal findings: Secondary | ICD-10-CM | POA: Diagnosis not present

## 2016-04-05 DIAGNOSIS — E7800 Pure hypercholesterolemia, unspecified: Secondary | ICD-10-CM

## 2016-04-05 DIAGNOSIS — I10 Essential (primary) hypertension: Secondary | ICD-10-CM

## 2016-04-05 DIAGNOSIS — E538 Deficiency of other specified B group vitamins: Secondary | ICD-10-CM

## 2016-04-05 MED ORDER — LOSARTAN POTASSIUM 50 MG PO TABS: 50.0000 mg | ORAL_TABLET | Freq: Every day | ORAL | 3 refills | Status: DC

## 2016-04-05 MED ORDER — SUVOREXANT 20 MG PO TABS: 1.0000 | ORAL_TABLET | Freq: Every evening | ORAL | 0 refills | Status: DC | PRN

## 2016-04-05 MED ORDER — SUVOREXANT 10 MG PO TABS: 1.0000 | ORAL_TABLET | Freq: Every evening | ORAL | 0 refills | Status: DC | PRN

## 2016-04-05 MED ORDER — SUVOREXANT 15 MG PO TABS: 1.0000 | ORAL_TABLET | Freq: Every evening | ORAL | 0 refills | Status: DC | PRN

## 2016-04-05 NOTE — Progress Notes (Signed)
Subjective:    Patient ID: Emily Doyle, female    DOB: 1946/01/17, 70 y.o.   MRN: [id number]  HPI Patient is here today for annual wellness exam and follow up of chronic medical problems which includes hypertension and hyperlipidemia. She is taking medications regularly.The family history was reviewed. Her father died of prostate cancer and her mother had Alzheimer's disease. The patient is doing well overall with no specific problems. She has plans to get her pelvic exam and mammogram in [facility] as well as DEXA scan in early January. She is up-to-date on her chest x-ray. She will be given an FOBT to return. She will get lab work and will return for this fasting. She is requesting a refill on her blood pressure pill. She had an EKG this summer. The patient has a history of allergic rhinitis. She also has B12 deficiency. She is getting B12 shots. She has lumbar degenerative disc disease and hip pain. The patient has had in the past few months on MRI of the lumbar spine and an MRI of the brain and she will be given copies of this during the visit today for her records. The patient is doing well overall. She denies any chest pain pressure tightness or palpitations. Several years ago she did have a cardiac ablation and has had no problems with palpitations cents. She denies any trouble with heartburn indigestion nausea vomiting diarrhea or blood in the stool. She is up-to-date on her colonoscopies. There is no family history of colon cancer. She will be given an FOBT to return today. She denies any trouble with passing her water. She does have a pelvic exam scheduled for January along with a DEXA scan and a mammogram. She will make sure we get a to her current lab work that is to be drawn.     Patient Active Problem List   Diagnosis Date Noted  . Macrocytic anemia 12/28/2015  . Right lumbar radiculopathy 06/30/2015  . Lumbar degenerative disc disease 05/19/2015  . Right hip pain 04/12/2015  .  Greater trochanteric bursitis of right hip 04/12/2015  . Arthritis of right hip 04/12/2015  . Insomnia 03/03/2015  . Sciatica of right side 02/08/2015  . UTI (urinary tract infection) 12/07/2014  . Family history of COPD (chronic obstructive pulmonary disease) 02/11/2014  . Allergic rhinitis due to pollen 02/11/2014  . Vitamin B 12 deficiency 07/13/2010  . Hyperlipidemia   . HTN (hypertension), benign   . Tachycardia   . Diverticulosis   . Colon polyps    Outpatient Encounter Prescriptions as of 04/05/2016  Medication Sig  . aspirin 81 MG EC tablet Take 81 mg by mouth daily.   . Cholecalciferol (VITAMIN D3) 5000 UNITS TABS Take 5,000 Units by mouth daily.   . cyanocobalamin (,VITAMIN B-12,) 1000 MCG/ML injection INJECT 1 ML (1000MG) weekly for 4 weeks and then resume monthly  . gabapentin (NEURONTIN) 100 MG capsule Take 1 capsule (100 mg total) by mouth 4 (four) times daily. (Patient taking differently: Take 100 mg by mouth 2 (two) times daily. )  . Ibuprofen-Diphenhydramine Cit (ADVIL PM PO) Take 1 tablet by mouth at bedtime.  Marland Kitchen losartan (COZAAR) 50 MG tablet Take 1 tablet (50 mg total) by mouth daily.  . Multiple Vitamin (MULTIVITAMIN WITH MINERALS) TABS tablet Take 1 tablet by mouth daily.  . pravastatin (PRAVACHOL) 40 MG tablet TAKE 1 & 1/2 TABLET AT BEDTIME  . [DISCONTINUED] HYDROcodone-acetaminophen (NORCO) 5-325 MG tablet Take 1 tablet by mouth every 6 (  six) hours as needed for moderate pain.  Marland Kitchen ipratropium (ATROVENT) 0.06 % nasal spray Place 2 sprays into both nostrils 2 (two) times daily. (Patient not taking: Reported on 04/05/2016)  . [DISCONTINUED] cyanocobalamin (,VITAMIN B-12,) 1000 MCG/ML injection INJECT 1 ML (1000MG) ONCE A MONTH AS DIRECTED (Patient not taking: Reported on 02/16/2016)  . [DISCONTINUED] cyanocobalamin (,VITAMIN B-12,) 1000 MCG/ML injection INJECT 1 ML (1000MG) ONCE A MONTH AS DIRECTED (Patient not taking: Reported on 02/16/2016)  . [DISCONTINUED]  montelukast (SINGULAIR) 10 MG tablet Take 1 tablet (10 mg total) by mouth daily.   No facility-administered encounter medications on file as of 04/05/2016.      Review of Systems  Constitutional: Negative.   HENT: Negative.   Eyes: Negative.   Respiratory: Negative.   Cardiovascular: Negative.   Gastrointestinal: Negative.   Endocrine: Negative.   Genitourinary: Negative.   Musculoskeletal: Negative.   Skin: Negative.   Allergic/Immunologic: Negative.   Neurological: Negative.   Hematological: Negative.   Psychiatric/Behavioral: Negative.        Objective:   Physical Exam  Constitutional: She is oriented to person, place, and time. She appears well-developed and well-nourished.  Patient is pleasant and alert.  HENT:  Head: Normocephalic and atraumatic.  Right Ear: External ear normal.  Left Ear: External ear normal.  Mouth/Throat: Oropharynx is clear and moist.  Slight nasal congestion bilaterally  Eyes: Conjunctivae and EOM are normal. Pupils are equal, round, and reactive to light. Right eye exhibits no discharge. Left eye exhibits no discharge. No scleral icterus.  Neck: Normal range of motion. Neck supple. No thyromegaly present.  No bruits thyromegaly or anterior cervical adenopathy  Cardiovascular: Normal rate, regular rhythm, normal heart sounds and intact distal pulses.   No murmur heard. Heart has a regular rate and rhythm at 84/m  Pulmonary/Chest: Effort normal and breath sounds normal. No respiratory distress. She has no wheezes. She has no rales. She exhibits no tenderness.  Clear anteriorly and posteriorly  Abdominal: Soft. Bowel sounds are normal. She exhibits no mass. There is no tenderness. There is no rebound and no guarding.  No abdominal tenderness masses bruits organ enlargement or inguinal adenopathy  Musculoskeletal: Normal range of motion. She exhibits no edema.  Lymphadenopathy:    She has no cervical adenopathy.  Neurological: She is alert and  oriented to person, place, and time. She has normal reflexes. No cranial nerve deficit.  Skin: Skin is warm and dry. No rash noted.  The skin is dry in general.  Psychiatric: She has a normal mood and affect. Her behavior is normal. Judgment and thought content normal.  Nursing note and vitals reviewed.  BP (!) 141/86 (BP Location: Left Arm)   Pulse 84   Temp 98 F (36.7 C) (Oral)   Ht '5\' 5"'  (1.651 m)   Wt 137 lb (62.1 kg)   BMI 22.80 kg/m         Assessment & Plan:  1. Pure hypercholesterolemia -Tinny with current treatment pending results of lab work - NMR, lipoprofile; Future  2. HTN (hypertension), benign -Repeat blood pressure was good today at 122/82 in the right arm sitting. - BMP8+EGFR; Future - Hepatic function panel; Future  3. Annual physical exam -Get pelvic exam and mammogram as planned with gynecology and DEXA scan - BMP8+EGFR; Future - CBC with Differential/Platelet; Future - Hepatic function panel; Future - NMR, lipoprofile; Future - VITAMIN D 25 Hydroxy (Vit-D Deficiency, Fractures); Future - Thyroid Panel With TSH; Future  4. Vitamin B 12 deficiency -  Continue with B12 treatment as doing pending results of any lab work  5. Primary insomnia -Try Belsomra--- the patient was instructed in use and increasing the dose to see if this will help with her insomnia.   Meds ordered this encounter  Medications  . losartan (COZAAR) 50 MG tablet    Sig: Take 1 tablet (50 mg total) by mouth daily.    Dispense:  90 tablet    Refill:  3  . Suvorexant (BELSOMRA) 10 MG TABS    Sig: Take 1 tablet by mouth at bedtime as needed.    Dispense:  10 tablet    Refill:  0  . Suvorexant (BELSOMRA) 15 MG TABS    Sig: Take 1 tablet by mouth at bedtime as needed.    Dispense:  10 tablet    Refill:  0  . Suvorexant (BELSOMRA) 20 MG TABS    Sig: Take 1 tablet by mouth at bedtime as needed.    Dispense:  10 tablet    Refill:  0   Patient Instructions                        Medicare Annual Wellness Visit  [facility] and the medical providers at [facility] strive to bring you the best medical care.  In doing so we not only want to address your current medical conditions and concerns but also to detect new conditions early and prevent illness, disease and health-related problems.    Medicare offers a yearly Wellness Visit which allows our clinical staff to assess your need for preventative services including immunizations, lifestyle education, counseling to decrease risk of preventable diseases and screening for fall risk and other medical concerns.    This visit is provided free of charge (no copay) for all Medicare recipients. The clinical pharmacists at [facility] have begun to conduct these Wellness Visits which will also include a thorough review of all your medications.    As you primary medical provider recommend that you make an appointment for your Annual Wellness Visit if you have not done so already this year.  You may set up this appointment before you leave today or you may call back ([phone]) and schedule an appointment.  Please make sure when you call that you mention that you are scheduling your Annual Wellness Visit with the clinical pharmacist so that the appointment may be made for the proper length of time.      Continue current medications. Continue good therapeutic lifestyle changes which include good diet and exercise. Fall precautions discussed with patient. If an FOBT was given today- please return it to our front desk. If you are over 52 years old - you may need Prevnar 1 or the adult Pneumonia vaccine.  **Flu shots are available--- please call and schedule a FLU-CLINIC appointment**  After your visit with [place] today you will receive a survey in the mail or online from [employer] regarding your care with [place]. Please take a moment to fill this out. Your feedback is very important to [place] as you  can help [place] better understand your patient needs as well as improve your experience and satisfaction. WE CARE ABOUT YOU!!!  Try Belsomra as directed Always be careful do not put yourself at risk for falling Avoid sedating antihistamines Drink plenty of fluids and stay well hydrated   Arrie Senate MD

## 2016-04-05 NOTE — Patient Instructions (Addendum)
Medicare Annual Wellness Visit  [facility] and the medical providers at [facility] strive to bring you the best medical care.  In doing so we not only want to address your current medical conditions and concerns but also to detect new conditions early and prevent illness, disease and health-related problems.    Medicare offers a yearly Wellness Visit which allows our clinical staff to assess your need for preventative services including immunizations, lifestyle education, counseling to decrease risk of preventable diseases and screening for fall risk and other medical concerns.    This visit is provided free of charge (no copay) for all Medicare recipients. The clinical pharmacists at [facility] have begun to conduct these Wellness Visits which will also include a thorough review of all your medications.    As you primary medical provider recommend that you make an appointment for your Annual Wellness Visit if you have not done so already this year.  You may set up this appointment before you leave today or you may call back [phone] and schedule an appointment.  Please make sure when you call that you mention that you are scheduling your Annual Wellness Visit with the clinical pharmacist so that the appointment may be made for the proper length of time.      Continue current medications. Continue good therapeutic lifestyle changes which include good diet and exercise. Fall precautions discussed with patient. If an FOBT was given today- please return it to our front desk. If you are over 51 years old - you may need Prevnar 21 or the adult Pneumonia vaccine.  **Flu shots are available--- please call and schedule a FLU-CLINIC appointment**  After your visit with [place] today you will receive a survey in the mail or online from [employer] regarding your care with [place]. Please take a moment to fill this out. Your feedback is very  important to [place] as you can help [place] better understand your patient needs as well as improve your experience and satisfaction. WE CARE ABOUT YOU!!!  Try Belsomra as directed Always be careful do not put yourself at risk for falling Avoid sedating antihistamines Drink plenty of fluids and stay well hydrated

## 2016-04-07 ENCOUNTER — Other Ambulatory Visit: Payer: BLUE CROSS/BLUE SHIELD

## 2016-04-07 DIAGNOSIS — I10 Essential (primary) hypertension: Secondary | ICD-10-CM

## 2016-04-07 DIAGNOSIS — Z1211 Encounter for screening for malignant neoplasm of colon: Secondary | ICD-10-CM | POA: Diagnosis not present

## 2016-04-07 DIAGNOSIS — Z0000 Encounter for general adult medical examination without abnormal findings: Secondary | ICD-10-CM | POA: Diagnosis not present

## 2016-04-07 DIAGNOSIS — E7800 Pure hypercholesterolemia, unspecified: Secondary | ICD-10-CM

## 2016-04-08 LAB — CBC WITH DIFFERENTIAL/PLATELET
BASOS: 2 %
Basophils Absolute: 0.1 10*3/uL (ref 0.0–0.2)
EOS (ABSOLUTE): 0.1 10*3/uL (ref 0.0–0.4)
EOS: 2 %
HEMATOCRIT: 31.2 % — AB (ref 34.0–46.6)
Hemoglobin: 10.4 g/dL — ABNORMAL LOW (ref 11.1–15.9)
IMMATURE GRANULOCYTES: 0 %
Immature Grans (Abs): 0 10*3/uL (ref 0.0–0.1)
Lymphocytes Absolute: 1.7 10*3/uL (ref 0.7–3.1)
Lymphs: 43 %
MCH: 36.1 pg — ABNORMAL HIGH (ref 26.6–33.0)
MCHC: 33.3 g/dL (ref 31.5–35.7)
MCV: 108 fL — AB (ref 79–97)
MONOS ABS: 0.3 10*3/uL (ref 0.1–0.9)
Monocytes: 7 %
NEUTROS ABS: 1.8 10*3/uL (ref 1.4–7.0)
NEUTROS PCT: 46 %
Platelets: 404 10*3/uL — ABNORMAL HIGH (ref 150–379)
RBC: 2.88 x10E6/uL — ABNORMAL LOW (ref 3.77–5.28)
RDW: 17.5 % — AB (ref 12.3–15.4)
WBC: 4 10*3/uL (ref 3.4–10.8)

## 2016-04-08 LAB — BMP8+EGFR
BUN / CREAT RATIO: 22 (ref 12–28)
BUN: 14 mg/dL (ref 8–27)
CO2: 22 mmol/L (ref 18–29)
CREATININE: 0.64 mg/dL (ref 0.57–1.00)
Calcium: 9.5 mg/dL (ref 8.7–10.3)
Chloride: 102 mmol/L (ref 96–106)
GFR, EST AFRICAN AMERICAN: 105 mL/min/{1.73_m2} (ref >59)
GFR, EST NON AFRICAN AMERICAN: 91 mL/min/{1.73_m2} (ref >59)
Glucose: 89 mg/dL (ref 65–99)
Potassium: 4.4 mmol/L (ref 3.5–5.2)
SODIUM: 141 mmol/L (ref 134–144)

## 2016-04-08 LAB — HEPATIC FUNCTION PANEL
ALT: 13 IU/L (ref 0–32)
AST: 16 IU/L (ref 0–40)
Albumin: 4.5 g/dL (ref 3.5–4.8)
Alkaline Phosphatase: 70 IU/L (ref 39–117)
BILIRUBIN TOTAL: 0.5 mg/dL (ref 0.0–1.2)
BILIRUBIN, DIRECT: 0.13 mg/dL (ref 0.00–0.40)
Total Protein: 6.5 g/dL (ref 6.0–8.5)

## 2016-04-08 LAB — THYROID PANEL WITH TSH
Free Thyroxine Index: 1.7 (ref 1.2–4.9)
T3 Uptake Ratio: 28 % (ref 24–39)
T4, Total: 6.2 ug/dL (ref 4.5–12.0)
TSH: 0.834 u[IU]/mL (ref 0.450–4.500)

## 2016-04-08 LAB — NMR, LIPOPROFILE
Cholesterol: 199 mg/dL (ref 100–199)
HDL Cholesterol by NMR: 108 mg/dL (ref >39)
HDL Particle Number: 43.1 umol/L (ref >=30.5)
LDL Particle Number: 723 nmol/L (ref <1000)
LDL SIZE: 21.5 nm (ref >20.5)
LDL-C: 71 mg/dL (ref 0–99)
LP-IR SCORE: 37 (ref <=45)
TRIGLYCERIDES BY NMR: 100 mg/dL (ref 0–149)

## 2016-04-08 LAB — VITAMIN D 25 HYDROXY (VIT D DEFICIENCY, FRACTURES): Vit D, 25-Hydroxy: 67.3 ng/mL (ref 30.0–100.0)

## 2016-04-09 LAB — FECAL OCCULT BLOOD, IMMUNOCHEMICAL: Fecal Occult Bld: NEGATIVE

## 2016-05-12 DIAGNOSIS — Z1231 Encounter for screening mammogram for malignant neoplasm of breast: Secondary | ICD-10-CM | POA: Diagnosis not present

## 2016-06-02 ENCOUNTER — Other Ambulatory Visit: Payer: Self-pay | Admitting: Family Medicine

## 2016-07-11 DIAGNOSIS — H5203 Hypermetropia, bilateral: Secondary | ICD-10-CM | POA: Diagnosis not present

## 2016-07-11 DIAGNOSIS — H2513 Age-related nuclear cataract, bilateral: Secondary | ICD-10-CM | POA: Diagnosis not present

## 2016-08-14 ENCOUNTER — Other Ambulatory Visit: Payer: Self-pay | Admitting: Family Medicine

## 2016-08-14 ENCOUNTER — Telehealth: Payer: Self-pay

## 2016-08-14 NOTE — Telephone Encounter (Signed)
Spoke with patient. Explained to her that we are unable to refill the rx without seeing her as it has been 1 year since her last visit with Dr. Tamala Julian. She said that she uses gabapentin occasionally and is going to try to see how she does without it. Told patient to let us know if she changes her mind and would like an appointment.

## 2016-08-14 NOTE — Telephone Encounter (Signed)
Left message for patient to call me back regarding Rx request. She will need to be seen in the office before Dr. Tamala Julian will refill her gabapentin as she has not been seen for 1 year.

## 2016-09-28 ENCOUNTER — Other Ambulatory Visit: Payer: Self-pay | Admitting: Family Medicine

## 2016-10-09 ENCOUNTER — Encounter: Payer: Self-pay | Admitting: Family Medicine

## 2016-10-09 ENCOUNTER — Other Ambulatory Visit: Payer: Self-pay | Admitting: Family Medicine

## 2016-10-09 ENCOUNTER — Ambulatory Visit (INDEPENDENT_AMBULATORY_CARE_PROVIDER_SITE_OTHER): Payer: BLUE CROSS/BLUE SHIELD | Admitting: Family Medicine

## 2016-10-09 VITALS — BP 128/75 | HR 96 | Temp 98.9°F | Ht 65.0 in | Wt 139.0 lb

## 2016-10-09 DIAGNOSIS — N3000 Acute cystitis without hematuria: Secondary | ICD-10-CM | POA: Diagnosis not present

## 2016-10-09 LAB — MICROSCOPIC EXAMINATION
RBC, UA: NONE SEEN /hpf (ref 0–?)
RENAL EPITHEL UA: NONE SEEN /HPF

## 2016-10-09 LAB — URINALYSIS, COMPLETE
Bilirubin, UA: NEGATIVE
Glucose, UA: NEGATIVE
Ketones, UA: NEGATIVE
Nitrite, UA: NEGATIVE
PH UA: 8.5 — AB (ref 5.0–7.5)
PROTEIN UA: NEGATIVE
RBC, UA: NEGATIVE
Specific Gravity, UA: 1.015 (ref 1.005–1.030)
Urobilinogen, Ur: 1 mg/dL (ref 0.2–1.0)

## 2016-10-09 MED ORDER — CEPHALEXIN 500 MG PO CAPS: 500.0000 mg | ORAL_CAPSULE | Freq: Four times a day (QID) | ORAL | 0 refills | Status: DC

## 2016-10-09 NOTE — Progress Notes (Signed)
   HPI  Patient presents today here with abdominal pain.  Patient complains of off and on intermittent abdominal pain crampy in nature for weeks. Patient states symptoms have been going on for about 3 weeks. This morning she measured a temperature as high as 100.6, DTRs L is 100.3.  Patient states that previously she had very similar symptoms with a UTI. She was hospitalized at that time.  She also has body aches and malaise.  PMH: Smoking status noted ROS: Per HPI  Objective: BP 128/75   Pulse 96   Temp 98.9 F (37.2 C) (Oral)   Ht 5\' 5"  (1.651 m)   Wt 139 lb (63 kg)   BMI 23.13 kg/m  Gen: NAD, alert, cooperative with exam HEENT: NCAT CV: RRR, good S1/S2, no murmur Resp: CTABL, no wheezes, non-labored Abd: Soft, no guarding, positive bowel sounds, no CVA tenderness, mild tenderness to palpation throughout including suprapubic area Ext: No edema, warm Neuro: Alert and oriented, No gross deficits  Assessment and plan:  # UTI Urinalysis consistent with UTI, no red flags Treat with Keflex, urine culture Tylenol for fever syndrome Very low threshold for follow-up if symptoms worsen or do not improve   Orders Placed This Encounter  Procedures  . Urine Culture  . Urinalysis, Complete    Meds ordered this encounter  Medications  . Naproxen Sodium (ALEVE PO)    Sig: Take by mouth.  . cephALEXin (KEFLEX) 500 MG capsule    Sig: Take 1 capsule (500 mg total) by mouth 4 (four) times daily.    Dispense:  21 capsule    Refill:  [place], MD [facility] 10/09/2016, 4:14 PM

## 2016-10-11 ENCOUNTER — Other Ambulatory Visit: Payer: BLUE CROSS/BLUE SHIELD

## 2016-10-11 ENCOUNTER — Telehealth: Payer: Self-pay | Admitting: Family Medicine

## 2016-10-11 ENCOUNTER — Other Ambulatory Visit: Payer: Self-pay | Admitting: Family Medicine

## 2016-10-11 DIAGNOSIS — R109 Unspecified abdominal pain: Secondary | ICD-10-CM

## 2016-10-11 LAB — URINE CULTURE

## 2016-10-11 NOTE — Telephone Encounter (Signed)
Patient called back saying when I talked to her this morning she thought she felt better but now does not. States she is still having abdominal pain but feels more like bloating. Patient states she felt better yesterday then she did today and would like to come in today to get lab work since she was going out of town tomorrow.  No fever. Please place lab work. Patient aware to come by today and the orders will be in. Please advise

## 2016-10-11 NOTE — Telephone Encounter (Signed)
Lab orders placed, if persistent and no clear etiology emerges would consider CT abd.   Laroy Apple, MD [facility] 10/11/2016, 3:48 PM

## 2016-10-12 LAB — CMP14+EGFR
A/G RATIO: 1.8 (ref 1.2–2.2)
ALBUMIN: 4.4 g/dL (ref 3.5–4.8)
ALT: 12 IU/L (ref 0–32)
AST: 17 IU/L (ref 0–40)
Alkaline Phosphatase: 78 IU/L (ref 39–117)
BILIRUBIN TOTAL: 0.2 mg/dL (ref 0.0–1.2)
BUN / CREAT RATIO: 26 (ref 12–28)
BUN: 15 mg/dL (ref 8–27)
CHLORIDE: 103 mmol/L (ref 96–106)
CO2: 21 mmol/L (ref 20–29)
Calcium: 9.5 mg/dL (ref 8.7–10.3)
Creatinine, Ser: 0.57 mg/dL (ref 0.57–1.00)
GFR calc non Af Amer: 94 mL/min/{1.73_m2} (ref >59)
GFR, EST AFRICAN AMERICAN: 108 mL/min/{1.73_m2} (ref >59)
Globulin, Total: 2.5 g/dL (ref 1.5–4.5)
Glucose: 127 mg/dL — ABNORMAL HIGH (ref 65–99)
POTASSIUM: 4.3 mmol/L (ref 3.5–5.2)
Sodium: 141 mmol/L (ref 134–144)
TOTAL PROTEIN: 6.9 g/dL (ref 6.0–8.5)

## 2016-10-12 LAB — CBC WITH DIFFERENTIAL/PLATELET
BASOS ABS: 0 10*3/uL (ref 0.0–0.2)
Basos: 1 %
EOS (ABSOLUTE): 0.2 10*3/uL (ref 0.0–0.4)
Eos: 3 %
HEMOGLOBIN: 10.4 g/dL — AB (ref 11.1–15.9)
Hematocrit: 30.6 % — ABNORMAL LOW (ref 34.0–46.6)
Immature Grans (Abs): 0 10*3/uL (ref 0.0–0.1)
Immature Granulocytes: 0 %
LYMPHS ABS: 1.4 10*3/uL (ref 0.7–3.1)
Lymphs: 27 %
MCH: 36.5 pg — AB (ref 26.6–33.0)
MCHC: 34 g/dL (ref 31.5–35.7)
MCV: 107 fL — AB (ref 79–97)
MONOCYTES: 6 %
Monocytes Absolute: 0.3 10*3/uL (ref 0.1–0.9)
NEUTROS ABS: 3.2 10*3/uL (ref 1.4–7.0)
Neutrophils: 63 %
Platelets: 391 10*3/uL — ABNORMAL HIGH (ref 150–379)
RBC: 2.85 x10E6/uL — ABNORMAL LOW (ref 3.77–5.28)
RDW: 17.7 % — ABNORMAL HIGH (ref 12.3–15.4)
WBC: 5.1 10*3/uL (ref 3.4–10.8)

## 2016-10-15 LAB — SPECIMEN STATUS REPORT

## 2016-10-15 LAB — HGB A1C W/O EAG: Hgb A1c MFr Bld: 5.1 % (ref 4.8–5.6)

## 2016-10-15 LAB — LIPASE: LIPASE: 30 U/L (ref 14–85)

## 2016-11-23 ENCOUNTER — Other Ambulatory Visit: Payer: Self-pay | Admitting: Family Medicine

## 2016-11-29 ENCOUNTER — Ambulatory Visit (INDEPENDENT_AMBULATORY_CARE_PROVIDER_SITE_OTHER): Payer: BLUE CROSS/BLUE SHIELD | Admitting: Family Medicine

## 2016-11-29 ENCOUNTER — Encounter: Payer: Self-pay | Admitting: Family Medicine

## 2016-11-29 VITALS — BP 116/71 | HR 81 | Temp 97.9°F | Ht 65.0 in | Wt 134.8 lb

## 2016-11-29 DIAGNOSIS — K5900 Constipation, unspecified: Secondary | ICD-10-CM

## 2016-11-29 DIAGNOSIS — D539 Nutritional anemia, unspecified: Secondary | ICD-10-CM | POA: Diagnosis not present

## 2016-11-29 NOTE — Progress Notes (Signed)
   HPI  Patient presents today here with constipation and stomach pressure.  Patient was seen for somewhat similar symptoms about 3 months ago. She was treated for UTI, urine culture was found to be negative. She had resolving symptoms and so no further workup was performed. Basic labs were negative at that time.  Patient states over the last 1-2 months she's had early CTD, slight nausea, and feelings of constipation. She states that when she finally takes a laxative she has an extreme response to it and has multiple loose stools.  Denies fever, chills, sweats. She states that her aunt had stomach cancer and she can't help but be a little bit worried for that. She would like to defer GI appointment yet. She has had colon polyps and colonoscopies about every 5 years.  Her last colonoscopy was about a year ago.  PMH: Smoking status noted ROS: Per HPI  Objective: BP 116/71   Pulse 81   Temp 97.9 F (36.6 C) (Oral)   Ht 5\' 5"  (1.651 m)   Wt 134 lb 12.8 oz (61.1 kg)   BMI 22.43 kg/m  Gen: NAD, alert, cooperative with exam HEENT: NCAT CV: RRR, good S1/S2, no murmur Resp: CTABL, no wheezes, non-labored Abd: SNTND, BS present, no guarding or organomegaly Ext: No edema, warm Neuro: Alert and oriented, No gross deficits  Assessment and plan:  # Constipation With early CTD and mild nausea. Recommended daily mural ask for gentle regulation, also adding probiotic or daily yogurt to enhance and possibly restored bowel flora after her antibiotics a few months ago. Low threshold for return May need to be referred to GI again for EGD.  # Macrocytic anemia Patient on monthly B-12 injections, recommend B-12 level with next blood draw Asymptomatic   Laroy Apple, MD [facility] 11/29/2016, 12:12 PM

## 2016-11-29 NOTE — Patient Instructions (Signed)
Great to see you!  Start miralax 1 scoop dialy, change to 1/2 or 2 scoops daily to get 1 easy stool daily  Start either a pro-biotic twice daily or a good yogurt daily with live active cultures.   Let me know if you are not having good improvement in 4-6 weeks.

## 2016-12-12 DIAGNOSIS — Z01419 Encounter for gynecological examination (general) (routine) without abnormal findings: Secondary | ICD-10-CM | POA: Diagnosis not present

## 2016-12-12 DIAGNOSIS — N905 Atrophy of vulva: Secondary | ICD-10-CM | POA: Diagnosis not present

## 2016-12-12 DIAGNOSIS — N952 Postmenopausal atrophic vaginitis: Secondary | ICD-10-CM | POA: Diagnosis not present

## 2017-01-03 ENCOUNTER — Other Ambulatory Visit: Payer: Self-pay | Admitting: Family Medicine

## 2017-04-03 ENCOUNTER — Other Ambulatory Visit: Payer: Self-pay | Admitting: Family Medicine

## 2017-04-04 ENCOUNTER — Other Ambulatory Visit: Payer: Self-pay | Admitting: Family Medicine

## 2017-04-06 ENCOUNTER — Ambulatory Visit: Payer: BLUE CROSS/BLUE SHIELD | Admitting: Family Medicine

## 2017-04-07 ENCOUNTER — Encounter: Payer: Self-pay | Admitting: Family Medicine

## 2017-06-16 ENCOUNTER — Other Ambulatory Visit: Payer: Self-pay | Admitting: Family Medicine

## 2017-07-03 ENCOUNTER — Encounter: Payer: Self-pay | Admitting: Family Medicine

## 2017-07-03 ENCOUNTER — Ambulatory Visit: Payer: BLUE CROSS/BLUE SHIELD | Admitting: Family Medicine

## 2017-07-03 VITALS — BP 134/80 | HR 82 | Temp 97.8°F | Ht 65.0 in | Wt 143.2 lb

## 2017-07-03 DIAGNOSIS — I10 Essential (primary) hypertension: Secondary | ICD-10-CM

## 2017-07-03 DIAGNOSIS — E7800 Pure hypercholesterolemia, unspecified: Secondary | ICD-10-CM | POA: Diagnosis not present

## 2017-07-03 DIAGNOSIS — Z7184 Encounter for health counseling related to travel: Secondary | ICD-10-CM

## 2017-07-03 DIAGNOSIS — Z7189 Other specified counseling: Secondary | ICD-10-CM | POA: Diagnosis not present

## 2017-07-03 MED ORDER — CYANOCOBALAMIN 1000 MCG/ML IJ SOLN: INTRAMUSCULAR | 5 refills | Status: DC

## 2017-07-03 MED ORDER — LOSARTAN POTASSIUM 50 MG PO TABS: 50.0000 mg | ORAL_TABLET | Freq: Every day | ORAL | 3 refills | Status: DC

## 2017-07-03 MED ORDER — ZOLPIDEM TARTRATE 5 MG PO TABS
5.0000 mg | ORAL_TABLET | Freq: Every evening | ORAL | 0 refills | Status: DC | PRN
Start: 2017-07-03 — End: 2018-10-21

## 2017-07-03 MED ORDER — PRAVASTATIN SODIUM 40 MG PO TABS: ORAL_TABLET | ORAL | 3 refills | Status: DC

## 2017-07-03 NOTE — Progress Notes (Signed)
   HPI  Patient presents today for routine follow-up.  Patient explains that she feels well and has no complaints today.  She has had an unusual numbness and tingling type sensation of the left second toe which has been evaluated by podiatry.  She is been treated with steroid injection with no improvement. She does not want to pursue other workup for this.  Patient is up-to-date on colonoscopy.  She is also up-to-date on DEXA scan and mammogram.  She has good medication compliance with pravastatin and losartan, needs refills. Nonfasting today.  She has a planned trip to [place] coming up in 2 weeks, unexpectedly her husband has been diagnosed with prostate cancer and has to come back early to have surgery.  They will be leaving at 8 PM and flying overnight, she requests some Ambien to help with flight.  PMH: Smoking status noted ROS: Per HPI  Objective: BP 134/80   Pulse 82   Temp 97.8 F (36.6 C) (Oral)   Ht '5\' 5"'$  (1.651 m)   Wt 143 lb 3.2 oz (65 kg)   BMI 23.83 kg/m  Gen: NAD, alert, cooperative with exam HEENT: NCAT, EOMI, PERRL CV: RRR, good S1/S2, no murmur Resp: CTABL, no wheezes, non-labored Ext: No edema, warm Neuro: Alert and oriented, No gross deficits  Assessment and plan:  #Hyperlipidemia. Previously well controlled, continue pravastatin Nonfasting labs today  #Hypertension Well-controlled Continue losartan Labs today  #Travel advice Patient taking a red eye flight from [place], given small amount of Ambien to help with the flights    Orders Placed This Encounter  Procedures  . CMP14+EGFR  . CBC with Differential/Platelet  . Lipid panel  . TSH    Meds ordered this encounter  Medications  . losartan (COZAAR) 50 MG tablet    Sig: Take 1 tablet (50 mg total) by mouth daily.    Dispense:  90 tablet    Refill:  3  . pravastatin (PRAVACHOL) 40 MG tablet    Sig: TAKE 1 & 1/2 TABLET AT BEDTIME    Dispense:  135 tablet    Refill:  3  .  cyanocobalamin (,VITAMIN B-12,) 1000 MCG/ML injection    Sig: INJECT 1 ML ('1000MG'$ ) ONCE A MONTH AS DIRECTED    Dispense:  3 mL    Refill:  5  . zolpidem (AMBIEN) 5 MG tablet    Sig: Take 1 tablet (5 mg total) by mouth at bedtime as needed for sleep.    Dispense:  4 tablet    Refill:  0    Laroy Apple, MD [facility] 07/03/2017, 2:49 PM

## 2017-07-03 NOTE — Patient Instructions (Signed)
Great to see you!   

## 2017-07-04 LAB — CMP14+EGFR
ALT: 18 IU/L (ref 0–32)
AST: 23 IU/L (ref 0–40)
Albumin/Globulin Ratio: 2 (ref 1.2–2.2)
Albumin: 4.5 g/dL (ref 3.5–4.8)
Alkaline Phosphatase: 73 IU/L (ref 39–117)
BILIRUBIN TOTAL: 0.3 mg/dL (ref 0.0–1.2)
BUN/Creatinine Ratio: 27 (ref 12–28)
BUN: 17 mg/dL (ref 8–27)
CO2: 23 mmol/L (ref 20–29)
CREATININE: 0.64 mg/dL (ref 0.57–1.00)
Calcium: 9.4 mg/dL (ref 8.7–10.3)
Chloride: 105 mmol/L (ref 96–106)
GFR calc non Af Amer: 90 mL/min/{1.73_m2} (ref >59)
GFR, EST AFRICAN AMERICAN: 104 mL/min/{1.73_m2} (ref >59)
GLUCOSE: 117 mg/dL — AB (ref 65–99)
Globulin, Total: 2.3 g/dL (ref 1.5–4.5)
Potassium: 4.4 mmol/L (ref 3.5–5.2)
Sodium: 142 mmol/L (ref 134–144)
TOTAL PROTEIN: 6.8 g/dL (ref 6.0–8.5)

## 2017-07-04 LAB — CBC WITH DIFFERENTIAL/PLATELET
Basophils Absolute: 0.1 10*3/uL (ref 0.0–0.2)
Basos: 2 %
EOS (ABSOLUTE): 0.1 10*3/uL (ref 0.0–0.4)
EOS: 3 %
HEMOGLOBIN: 10.1 g/dL — AB (ref 11.1–15.9)
Hematocrit: 29.4 % — ABNORMAL LOW (ref 34.0–46.6)
IMMATURE GRANS (ABS): 0 10*3/uL (ref 0.0–0.1)
Immature Granulocytes: 0 %
LYMPHS: 30 %
Lymphocytes Absolute: 1.1 10*3/uL (ref 0.7–3.1)
MCH: 35.1 pg — ABNORMAL HIGH (ref 26.6–33.0)
MCHC: 34.4 g/dL (ref 31.5–35.7)
MCV: 102 fL — ABNORMAL HIGH (ref 79–97)
MONOCYTES: 9 %
Monocytes Absolute: 0.3 10*3/uL (ref 0.1–0.9)
Neutrophils Absolute: 2.1 10*3/uL (ref 1.4–7.0)
Neutrophils: 56 %
Platelets: 377 10*3/uL (ref 150–379)
RBC: 2.88 x10E6/uL — AB (ref 3.77–5.28)
RDW: 19.2 % — ABNORMAL HIGH (ref 12.3–15.4)
WBC: 3.7 10*3/uL (ref 3.4–10.8)

## 2017-07-04 LAB — TSH: TSH: 0.508 u[IU]/mL (ref 0.450–4.500)

## 2017-07-04 LAB — LIPID PANEL
CHOLESTEROL TOTAL: 190 mg/dL (ref 100–199)
Chol/HDL Ratio: 1.8 ratio (ref 0.0–4.4)
HDL: 104 mg/dL (ref >39)
LDL CALC: 61 mg/dL (ref 0–99)
TRIGLYCERIDES: 125 mg/dL (ref 0–149)
VLDL Cholesterol Cal: 25 mg/dL (ref 5–40)

## 2017-08-30 DIAGNOSIS — H2513 Age-related nuclear cataract, bilateral: Secondary | ICD-10-CM | POA: Diagnosis not present

## 2017-08-30 DIAGNOSIS — H524 Presbyopia: Secondary | ICD-10-CM | POA: Diagnosis not present

## 2017-10-14 IMAGING — MR MR LUMBAR SPINE W/O CM
4 of 5 series · 25 of 48 positions shown · non-contrast
Comparison: Lumbar radiographs 05/05/2015.

CLINICAL DATA: 69-year-old female with lumbar back pain radiating
to the right hip and leg intermittently for 6 months with no known
injury. Suspected right L4/L5 nerve impingement. Subsequent
encounter.

EXAM:
MRI LUMBAR SPINE WITHOUT CONTRAST
TECHNIQUE: Multiplanar, multisequence MR imaging of the lumbar spine was
performed. No intravenous contrast was administered.

[Series 4: T2 · sagittal · 4.0mm · 0.44mm/px · 6 of 12 slices shown (1 of 2)]
[im 1/12]
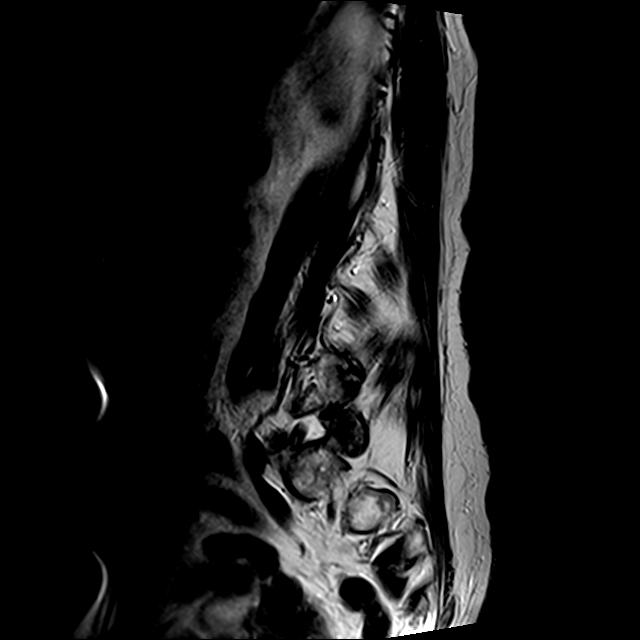
[im 3/12]
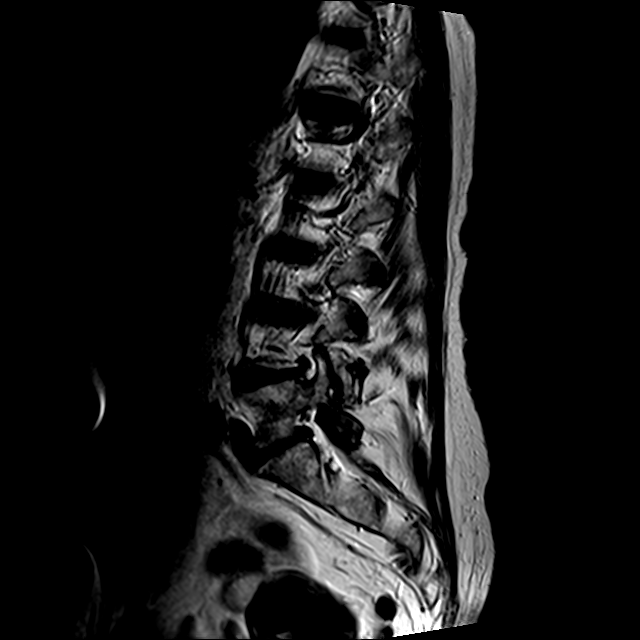
[im 5/12]
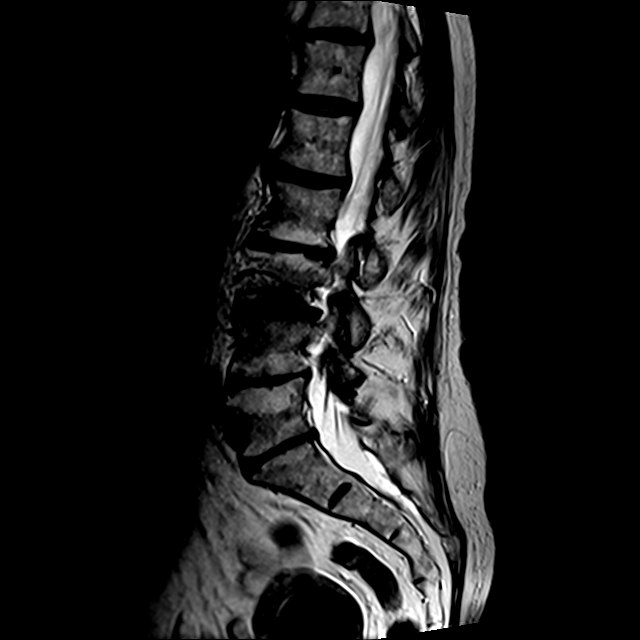
[im 7/12]
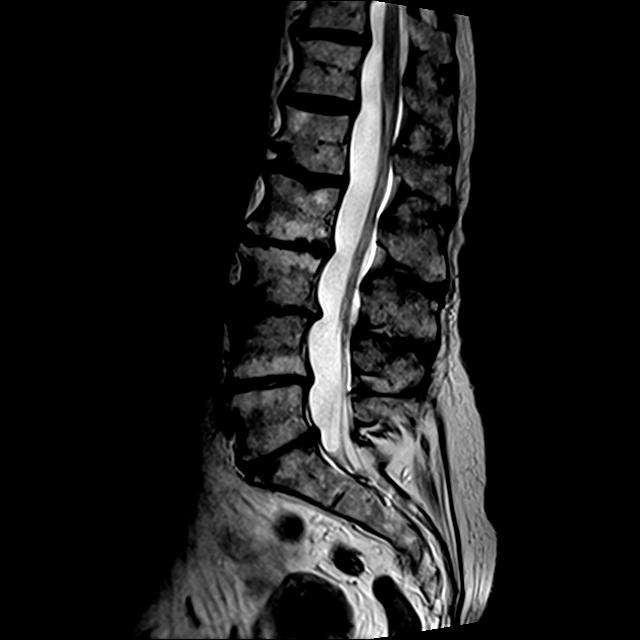
[im 9/12]
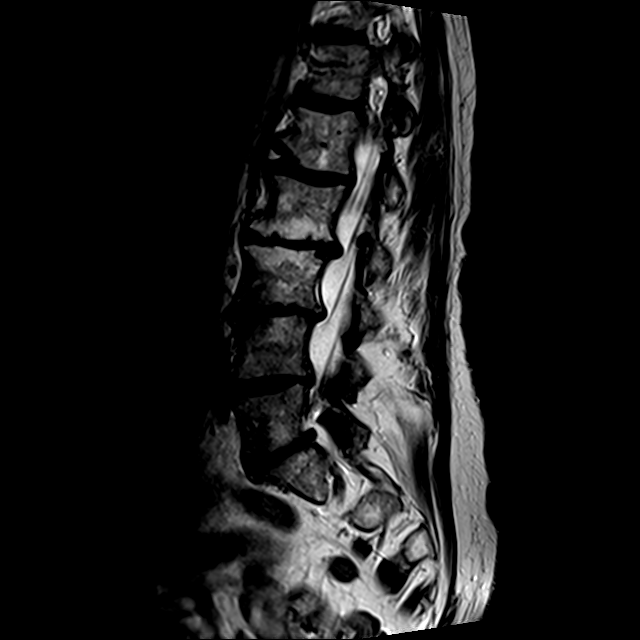
[im 12/12]
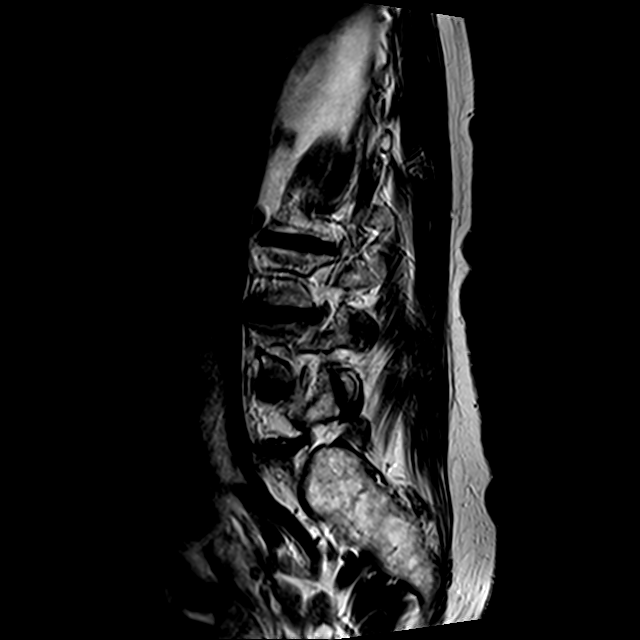

[Series 5: T1 · sagittal · 4.0mm · 0.88mm/px · 6 of 12 slices shown (1 of 2)]
[im 1/12]
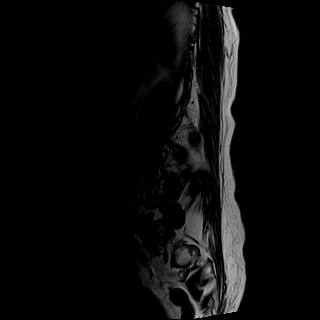
[im 3/12]
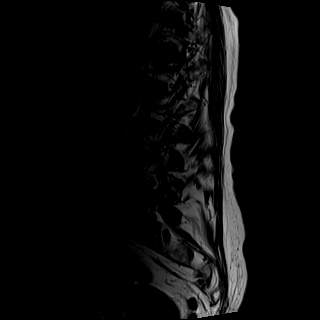
[im 5/12]
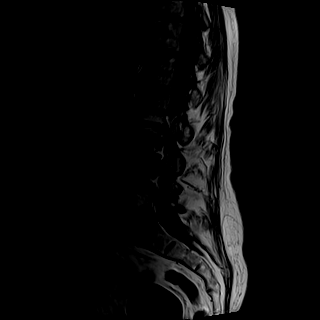
[im 7/12]
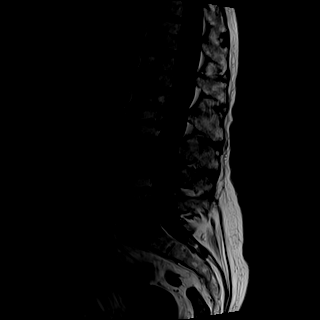
[im 9/12]
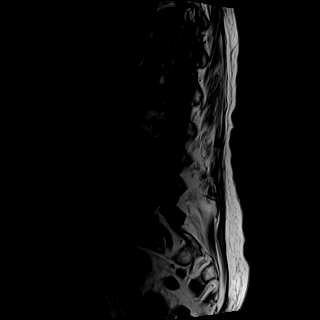
[im 12/12]
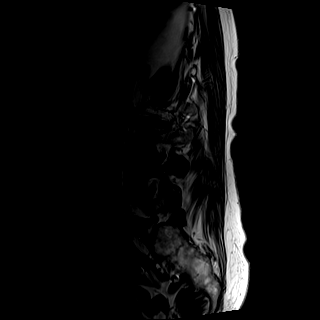

[Series 6: T1 · axial · 4.0mm · 0.37mm/px · z∈[-104,+59]mm · 4 of 29 slices shown (2 of 2)]
[im 1/29]
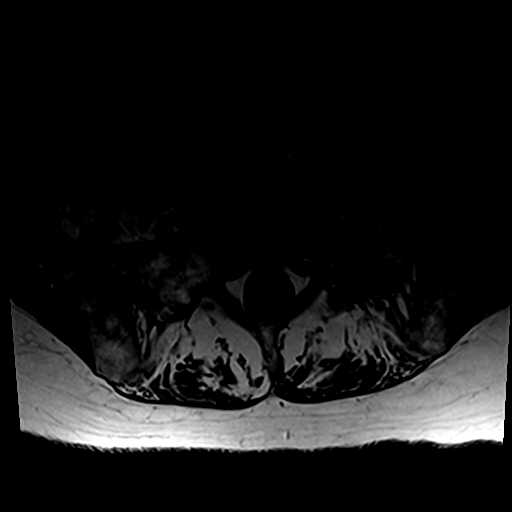
[im 5/29]
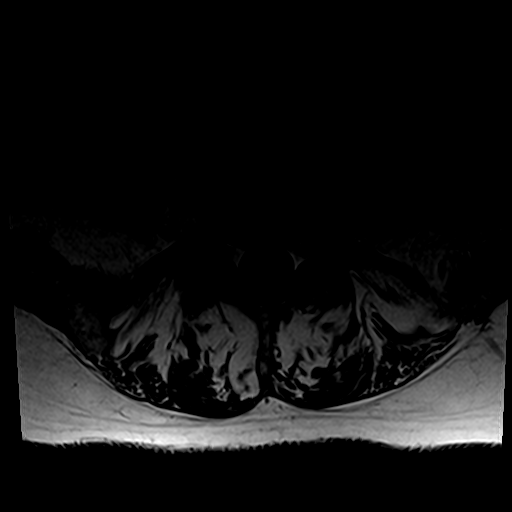
[im 15/29]
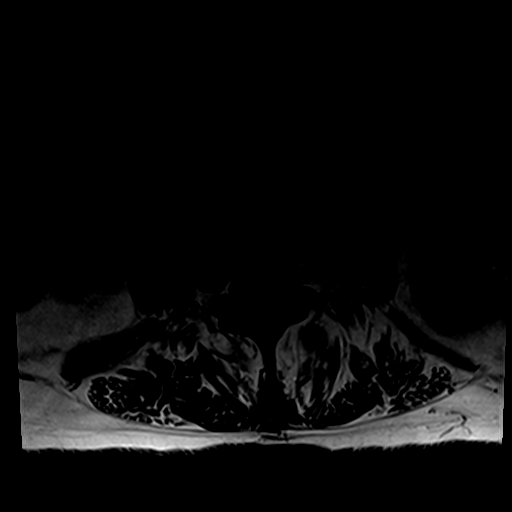
[im 25/29]
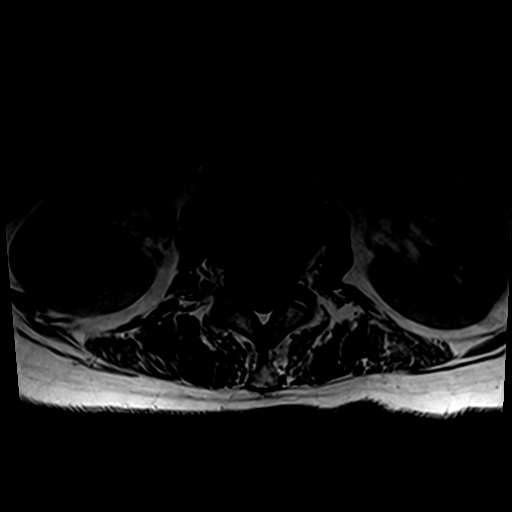

[Series 8: T2 · axial · 4.0mm · 0.74mm/px · z∈[-104,+119]mm · 9 of 29 slices shown (2 of 2)]
[im 1/29]
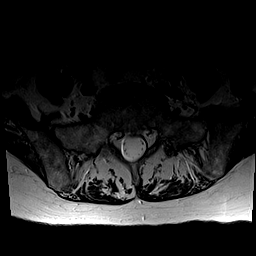
[im 5/29]
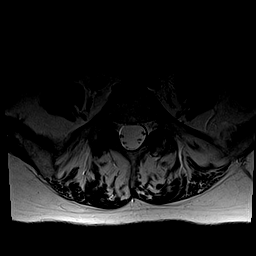
[im 9/29]
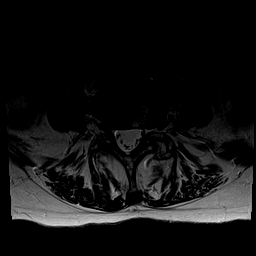
[im 13/29]
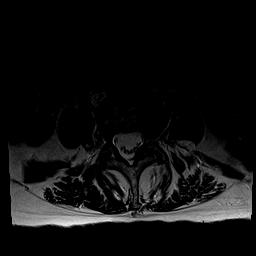
[im 15/29]
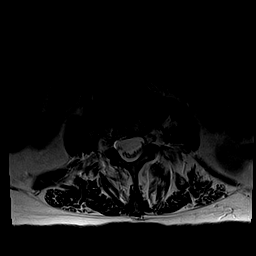
[im 17/29]
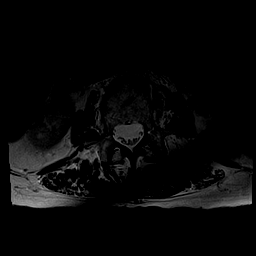
[im 21/29]
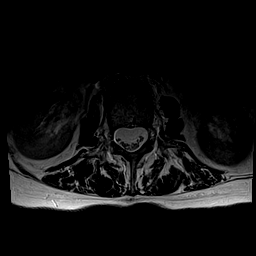
[im 25/29]
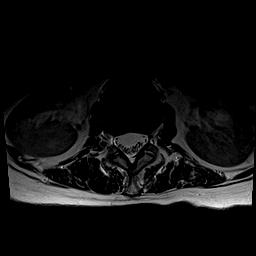
[im 29/29]
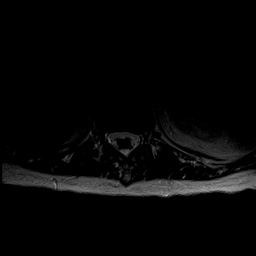

[25 of 48 positions shown; findings below may reference images not displayed]

FINDINGS: Normal lumbar segmentation demonstrated on the comparison
radiographs. Stable vertebral height and alignment. Widespread
lumbar disc space loss and degenerative endplate changes. Occasional
trace degenerative appearing endplate marrow edema (posteriorly
inferiorly at L2, right laterally and posteriorly at L3-L4). Visible
sacrum intact. No acute osseous abnormality identified.

Visualized lower thoracic spinal cord is normal with conus medularis
at T12-L1.

Negative visualized abdominal viscera. Posterior paraspinal muscle
atrophy.

T11-T12:  Disc bulge.  No stenosis.

T12-L1:  Left lateral disc osteophyte complex.  No stenosis.

L1-L2:  Anterior disc osteophyte complex.  No stenosis.

L2-L3: Disc space loss with right eccentric circumferential disc
osteophyte complex. Mild effacement of the right lateral recess at
the descending L3 nerve root level. Mild bilateral L2 foraminal
stenosis.

L3-L4: Disc space loss with left eccentric circumferential disc
osteophyte complex. Mild facet hypertrophy. No spinal or lateral
recess stenosis. Mild bilateral L3 foraminal stenosis.

L4-L5: Disc space loss. Circumferential disc osteophyte complex
eccentric to the left. Moderate facet hypertrophy greater on the
left. No spinal or lateral recess stenosis. Mild left greater than
right L4 foraminal stenosis.

L5-S1: Disc space loss. Right eccentric, mostly far lateral, disc
osteophyte complex. Mild facet hypertrophy. No spinal or lateral
recess stenosis. Borderline to mild left and mild to moderate right
L5 foraminal stenosis.
IMPRESSION: Diffuse chronic lumbar disc and endplate degeneration. No lumbar
spinal stenosis. No convincing lateral recess stenosis. Multilevel
predominately mild bilateral lumbar foraminal stenosis. This is mild
at the right L4 nerve level and up to moderate at the right L5 nerve
level.

## 2017-10-30 DIAGNOSIS — M79672 Pain in left foot: Secondary | ICD-10-CM | POA: Diagnosis not present

## 2017-10-30 DIAGNOSIS — M7742 Metatarsalgia, left foot: Secondary | ICD-10-CM | POA: Diagnosis not present

## 2018-02-12 ENCOUNTER — Ambulatory Visit (INDEPENDENT_AMBULATORY_CARE_PROVIDER_SITE_OTHER): Payer: BLUE CROSS/BLUE SHIELD

## 2018-02-12 DIAGNOSIS — Z23 Encounter for immunization: Secondary | ICD-10-CM

## 2018-07-26 ENCOUNTER — Other Ambulatory Visit: Payer: Self-pay | Admitting: *Deleted

## 2018-07-26 MED ORDER — PRAVASTATIN SODIUM 40 MG PO TABS: ORAL_TABLET | ORAL | 0 refills | Status: DC

## 2018-08-21 ENCOUNTER — Other Ambulatory Visit: Payer: Self-pay | Admitting: *Deleted

## 2018-09-04 ENCOUNTER — Other Ambulatory Visit: Payer: Self-pay | Admitting: *Deleted

## 2018-09-04 MED ORDER — CYANOCOBALAMIN 1000 MCG/ML IJ SOLN: INTRAMUSCULAR | 0 refills | Status: DC

## 2018-10-09 ENCOUNTER — Other Ambulatory Visit: Payer: Self-pay | Admitting: *Deleted

## 2018-10-15 ENCOUNTER — Telehealth: Payer: Self-pay | Admitting: Family Medicine

## 2018-10-15 NOTE — Telephone Encounter (Signed)
Patient has apt with Dr. Darnell Level 4/29 for est care. Patient would like to come in tomorrow for prior labs. States she would like everything tested even if she has to pay extra- please place labs. Patient also states she will run out of Losartan in 2 days. Requesting a week supply sent to [facility]. Please advise and send back to the pools.

## 2018-10-16 ENCOUNTER — Other Ambulatory Visit: Payer: Self-pay | Admitting: Family Medicine

## 2018-10-16 ENCOUNTER — Other Ambulatory Visit: Payer: BC Managed Care – PPO

## 2018-10-16 ENCOUNTER — Other Ambulatory Visit: Payer: Self-pay

## 2018-10-16 DIAGNOSIS — I10 Essential (primary) hypertension: Secondary | ICD-10-CM

## 2018-10-16 DIAGNOSIS — D539 Nutritional anemia, unspecified: Secondary | ICD-10-CM | POA: Diagnosis not present

## 2018-10-16 DIAGNOSIS — E569 Vitamin deficiency, unspecified: Secondary | ICD-10-CM | POA: Diagnosis not present

## 2018-10-16 DIAGNOSIS — E7800 Pure hypercholesterolemia, unspecified: Secondary | ICD-10-CM

## 2018-10-16 DIAGNOSIS — Z1329 Encounter for screening for other suspected endocrine disorder: Secondary | ICD-10-CM

## 2018-10-16 DIAGNOSIS — Z1321 Encounter for screening for nutritional disorder: Secondary | ICD-10-CM

## 2018-10-16 DIAGNOSIS — E538 Deficiency of other specified B group vitamins: Secondary | ICD-10-CM

## 2018-10-16 LAB — LIPID PANEL

## 2018-10-16 MED ORDER — LOSARTAN POTASSIUM 50 MG PO TABS: 50.0000 mg | ORAL_TABLET | Freq: Every day | ORAL | 3 refills | Status: DC

## 2018-10-16 NOTE — Telephone Encounter (Signed)
Left message, medication refilled, lab work ordered.

## 2018-10-16 NOTE — Telephone Encounter (Signed)
Med refilled. Labs placed.  Please come in fasting.

## 2018-10-17 LAB — CMP14+EGFR
ALT: 16 IU/L (ref 0–32)
AST: 18 IU/L (ref 0–40)
Albumin/Globulin Ratio: 1.8 (ref 1.2–2.2)
Albumin: 4.2 g/dL (ref 3.7–4.7)
Alkaline Phosphatase: 72 IU/L (ref 39–117)
BUN/Creatinine Ratio: 20 (ref 12–28)
BUN: 13 mg/dL (ref 8–27)
Bilirubin Total: 0.4 mg/dL (ref 0.0–1.2)
CO2: 22 mmol/L (ref 20–29)
Calcium: 9.5 mg/dL (ref 8.7–10.3)
Chloride: 107 mmol/L — ABNORMAL HIGH (ref 96–106)
Creatinine, Ser: 0.64 mg/dL (ref 0.57–1.00)
GFR calc Af Amer: 102 mL/min/{1.73_m2} (ref >59)
GFR calc non Af Amer: 89 mL/min/{1.73_m2} (ref >59)
Globulin, Total: 2.3 g/dL (ref 1.5–4.5)
Glucose: 90 mg/dL (ref 65–99)
Potassium: 4.7 mmol/L (ref 3.5–5.2)
Sodium: 142 mmol/L (ref 134–144)
Total Protein: 6.5 g/dL (ref 6.0–8.5)

## 2018-10-17 LAB — CBC WITH DIFFERENTIAL/PLATELET
Basophils Absolute: 0.1 10*3/uL (ref 0.0–0.2)
Basos: 4 %
EOS (ABSOLUTE): 0.2 10*3/uL (ref 0.0–0.4)
Eos: 6 %
Hematocrit: 29.8 % — ABNORMAL LOW (ref 34.0–46.6)
Hemoglobin: 10.3 g/dL — ABNORMAL LOW (ref 11.1–15.9)
Immature Grans (Abs): 0 10*3/uL (ref 0.0–0.1)
Immature Granulocytes: 0 %
Lymphocytes Absolute: 1.4 10*3/uL (ref 0.7–3.1)
Lymphs: 44 %
MCH: 34.6 pg — ABNORMAL HIGH (ref 26.6–33.0)
MCHC: 34.6 g/dL (ref 31.5–35.7)
MCV: 100 fL — ABNORMAL HIGH (ref 79–97)
Monocytes Absolute: 0.4 10*3/uL (ref 0.1–0.9)
Monocytes: 11 %
Neutrophils Absolute: 1.1 10*3/uL — ABNORMAL LOW (ref 1.4–7.0)
Neutrophils: 35 %
Platelets: 398 10*3/uL (ref 150–450)
RBC: 2.98 x10E6/uL — ABNORMAL LOW (ref 3.77–5.28)
RDW: 18.2 % — ABNORMAL HIGH (ref 11.7–15.4)
WBC: 3.2 10*3/uL — ABNORMAL LOW (ref 3.4–10.8)

## 2018-10-17 LAB — VITAMIN D 25 HYDROXY (VIT D DEFICIENCY, FRACTURES): Vit D, 25-Hydroxy: 65.1 ng/mL (ref 30.0–100.0)

## 2018-10-17 LAB — TSH: TSH: 1.15 u[IU]/mL (ref 0.450–4.500)

## 2018-10-17 LAB — LIPID PANEL
Chol/HDL Ratio: 1.9 ratio (ref 0.0–4.4)
Cholesterol, Total: 174 mg/dL (ref 100–199)
HDL: 94 mg/dL (ref >39)
LDL Calculated: 69 mg/dL (ref 0–99)
Triglycerides: 55 mg/dL (ref 0–149)
VLDL Cholesterol Cal: 11 mg/dL (ref 5–40)

## 2018-10-17 LAB — VITAMIN B12: Vitamin B-12: 1625 pg/mL — ABNORMAL HIGH (ref 232–1245)

## 2018-10-21 ENCOUNTER — Other Ambulatory Visit: Payer: Self-pay

## 2018-10-21 ENCOUNTER — Encounter: Payer: Self-pay | Admitting: Family Medicine

## 2018-10-21 ENCOUNTER — Ambulatory Visit: Payer: BC Managed Care – PPO | Admitting: Family Medicine

## 2018-10-21 VITALS — BP 127/84 | HR 82 | Temp 97.9°F | Ht 65.0 in | Wt 140.0 lb

## 2018-10-21 DIAGNOSIS — D729 Disorder of white blood cells, unspecified: Secondary | ICD-10-CM

## 2018-10-21 DIAGNOSIS — E538 Deficiency of other specified B group vitamins: Secondary | ICD-10-CM

## 2018-10-21 DIAGNOSIS — I10 Essential (primary) hypertension: Secondary | ICD-10-CM

## 2018-10-21 DIAGNOSIS — E7800 Pure hypercholesterolemia, unspecified: Secondary | ICD-10-CM | POA: Diagnosis not present

## 2018-10-21 MED ORDER — PRAVASTATIN SODIUM 40 MG PO TABS: ORAL_TABLET | ORAL | 3 refills | Status: DC

## 2018-10-21 NOTE — Progress Notes (Signed)
Subjective: CC: Hypertension PCP: Janora Norlander, DO FMB:WGYKZ Emily Doyle is a 73 y.o. female presenting to clinic today for:  1.  Hypertension and hyperlipidemia Patient with known hypertension and hyperlipidemia.  She is treated with Cozaar 50 mg daily and Pravachol 60 mg nightly.  She reports compliance with these.  She was being very physically active at the gym but since COVID-19 outbreak she has not been exercising as regularly.  She reports a good diet.  Does not endorse chest pain, shortness of breath or swelling in the legs.  She would like to review her labs which were obtained fasting a few days ago.  2.  Difficulty blowing nose Patient reports that she has difficulty getting enough gusto to blow her nose.  Denies any significant congestion or rhinorrhea.  She just thought it was somewhat unusual.  3.  Peripheral neuropathy Patient reports neuropathy in 1 of her toes that occurs predominantly at bedtime.  She started taking a "nerve shield plus" supplement OTC which has since helped substantially.  She is also on a vitamin B12 shot monthly.  Of note she also takes OTC oral B12.  She has had longstanding history of anemia.  Uncertain etiology as she is not a vegetarian, gets plenty of vegetables in her diet and takes supplements as above.   ROS: Per HPI  Allergies  Allergen Reactions  . Other Other (See Comments)    Msg= cramps and diarrhea   Past Medical History:  Diagnosis Date  . Complication of anesthesia    slow to wake up  . Diverticulosis   . History of colon polyps   . HTN (hypertension), benign   . Migraine with aura   . Other and unspecified hyperlipidemia   . Tachycardia   . Vitamin B 12 deficiency     Current Outpatient Medications:  .  aspirin 81 MG EC tablet, Take 81 mg by mouth daily. , Disp: , Rfl:  .  Cholecalciferol (VITAMIN D3) 5000 UNITS TABS, Take 5,000 Units by mouth daily. , Disp: , Rfl:  .  cyanocobalamin (,VITAMIN B-12,) 1000 MCG/ML  injection, INJECT 1 ML (1000MG ) ONCE A MONTH AS DIRECTED, Disp: 3 mL, Rfl: 0 .  Cyanocobalamin (B-12) 2500 MCG TABS, Take by mouth., Disp: , Rfl:  .  losartan (COZAAR) 50 MG tablet, Take 1 tablet (50 mg total) by mouth daily., Disp: 90 tablet, Rfl: 3 .  Melatonin 10 MG TABS, Take by mouth., Disp: , Rfl:  .  pravastatin (PRAVACHOL) 40 MG tablet, TAKE 1 & 1/2 TABLET AT BEDTIME, Disp: 135 tablet, Rfl: 0 .  pyridOXINE (VITAMIN B-6) 100 MG tablet, Take 100 mg by mouth daily., Disp: , Rfl:  .  UNABLE TO FIND, Med Name: Nerve shield plus, Disp: , Rfl:  .  UNABLE TO FIND, Med Name: CBD 10mg , Disp: , Rfl:  Social History   Socioeconomic History  . Marital status: Married    Spouse name: Not on file  . Number of children: Not on file  . Years of education: Not on file  . Highest education level: Not on file  Occupational History  . Not on file  Social Needs  . Financial resource strain: Not on file  . Food insecurity    Worry: Not on file    Inability: Not on file  . Transportation needs    Medical: Not on file    Non-medical: Not on file  Tobacco Use  . Smoking status: Former Smoker    Packs/day:  1.00    Years: 10.00    Pack years: 10.00    Types: Cigarettes    Quit date: 04/24/1982    Years since quitting: 36.5  . Smokeless tobacco: Never Used  Substance and Sexual Activity  . Alcohol use: Yes    Comment: 2 glass of wine per day  . Drug use: No  . Sexual activity: Yes    Partners: Male    Birth control/protection: Post-menopausal  Lifestyle  . Physical activity    Days per week: Not on file    Minutes per session: Not on file  . Stress: Not on file  Relationships  . Social Herbalist on phone: Not on file    Gets together: Not on file    Attends religious service: Not on file    Active member of club or organization: Not on file    Attends meetings of clubs or organizations: Not on file    Relationship status: Not on file  . Intimate partner violence    Fear of  current or ex partner: Not on file    Emotionally abused: Not on file    Physically abused: Not on file    Forced sexual activity: Not on file  Other Topics Concern  . Not on file  Social History Narrative  . Not on file   Family History  Problem Relation Age of Onset  . Alzheimer's disease Mother   . Allergic rhinitis Mother   . Cancer Father        prostate  . Allergic rhinitis Father   . Allergic rhinitis Sister     Objective: Office vital signs reviewed. BP 127/84   Pulse 82   Temp 97.9 Emily (36.6 C) (Oral)   Ht 5\' 5"  (1.651 m)   Wt 140 lb (63.5 kg)   BMI 23.30 kg/m   Physical Examination:  General: Awake, alert, well nourished, No acute distress HEENT: Normal    Neck: No masses palpated. No lymphadenopathy Cardio: regular rate and rhythm, S1S2 heard, no murmurs appreciated Pulm: clear to auscultation bilaterally, no wheezes, rhonchi or rales; normal work of breathing on room air Extremities: warm, well perfused, No edema, cyanosis or clubbing; +2 pulses bilaterally MSK: normal gait and station  The 10-year ASCVD risk score Mikey Bussing DC Jr., et al., 2013) is: 16%   Values used to calculate the score:     Age: 73 years     Sex: Female     Is Non-Hispanic African American: No     Diabetic: No     Tobacco smoker: No     Systolic Blood Pressure: 676 mmHg     Is BP treated: Yes     HDL Cholesterol: 94 mg/dL     Total Cholesterol: 174 mg/dL  Assessment/ Plan: 73 y.o. female   1. HTN (hypertension), benign Controlled.  Continue current regimen.  No refills needed  2. Pure hypercholesterolemia We reviewed her ASCVD risk score and I reviewed ways to improve this risk.  Continue pravastatin, LDL is less than 70 and at goal. - pravastatin (PRAVACHOL) 40 MG tablet; TAKE 1 & 1/2 TABLET AT BEDTIME  Dispense: 135 tablet; Refill: 3  3. Vitamin B 12 deficiency Advised to discontinue use of the injectable for the next couple of months.  Okay to continue oral supplementation  if she desires.  I did review her CBC with her which did show an elevated MCV, indicating macrocytic anemia.  Uncertain etiology given substantially elevated B12 level.  Possibly folate mediated but patient does eat green leafy vegetables regularly and takes a supplement with folic acid in it.  Given the suppression of the white blood cell count I am going to go ahead and place a referral to hematology for further evaluation of this abnormality.  We discussed the possible differential diagnoses and she voiced good understanding.  4. Abnormal WBC count - Ambulatory referral to Hematology   No orders of the defined types were placed in this encounter.  No orders of the defined types were placed in this encounter.    Janora Norlander, DO [facility] [phone]

## 2018-10-21 NOTE — Patient Instructions (Addendum)
Referral placed to hematology.  Consider administering your B12 every other month.  Your level was quite high, indicating over-treatment with B12.  Mediterranean Diet A Mediterranean diet refers to food and lifestyle choices that are based on the traditions of countries located on the [employer]. This way of eating has been shown to help prevent certain conditions and improve outcomes for people who have chronic diseases, like kidney disease and heart disease. What are tips for following this plan? Lifestyle  Cook and eat meals together with your family, when possible.  Drink enough fluid to keep your urine clear or pale yellow.  Be physically active every day. This includes: ? Aerobic exercise like running or swimming. ? Leisure activities like gardening, walking, or housework.  Get 7-8 hours of sleep each night.  If recommended by your health care provider, drink red wine in moderation. This means 1 glass a day for nonpregnant women and 2 glasses a day for men. A glass of wine equals 5 oz (150 mL). Reading food labels   Check the serving size of packaged foods. For foods such as rice and pasta, the serving size refers to the amount of cooked product, not dry.  Check the total fat in packaged foods. Avoid foods that have saturated fat or trans fats.  Check the ingredients list for added sugars, such as corn syrup. Shopping  At the grocery store, buy most of your food from the areas near the walls of the store. This includes: ? Fresh fruits and vegetables (produce). ? Grains, beans, nuts, and seeds. Some of these may be available in unpackaged forms or large amounts (in bulk). ? Fresh seafood. ? Poultry and eggs. ? Low-fat dairy products.  Buy whole ingredients instead of prepackaged foods.  Buy fresh fruits and vegetables in-season from local farmers markets.  Buy frozen fruits and vegetables in resealable bags.  If you do not have access to quality fresh seafood,  buy precooked frozen shrimp or canned fish, such as tuna, salmon, or sardines.  Buy small amounts of raw or cooked vegetables, salads, or olives from the deli or salad bar at your store.  Stock your pantry so you always have certain foods on hand, such as olive oil, canned tuna, canned tomatoes, rice, pasta, and beans. Cooking  Cook foods with extra-virgin olive oil instead of using butter or other vegetable oils.  Have meat as a side dish, and have vegetables or grains as your main dish. This means having meat in small portions or adding small amounts of meat to foods like pasta or stew.  Use beans or vegetables instead of meat in common dishes like chili or lasagna.  Experiment with different cooking methods. Try roasting or broiling vegetables instead of steaming or sauteing them.  Add frozen vegetables to soups, stews, pasta, or rice.  Add nuts or seeds for added healthy fat at each meal. You can add these to yogurt, salads, or vegetable dishes.  Marinate fish or vegetables using olive oil, lemon juice, garlic, and fresh herbs. Meal planning   Plan to eat 1 vegetarian meal one day each week. Try to work up to 2 vegetarian meals, if possible.  Eat seafood 2 or more times a week.  Have healthy snacks readily available, such as: ? Vegetable sticks with hummus. ? [place] yogurt. ? Fruit and nut trail mix.  Eat balanced meals throughout the week. This includes: ? Fruit: 2-3 servings a day ? Vegetables: 4-5 servings a day ? Low-fat dairy: 2  servings a day ? Fish, poultry, or lean meat: 1 serving a day ? Beans and legumes: 2 or more servings a week ? Nuts and seeds: 1-2 servings a day ? Whole grains: 6-8 servings a day ? Extra-virgin olive oil: 3-4 servings a day  Limit red meat and sweets to only a few servings a month What are my food choices?  Mediterranean diet ? Recommended  Grains: Whole-grain pasta. Brown rice. Bulgar wheat. Polenta. Couscous. Whole-wheat bread.  [place].  Vegetables: Artichokes. Beets. Broccoli. Cabbage. Carrots. Eggplant. Green beans. Chard. Kale. Spinach. Onions. Leeks. Peas. Squash. Tomatoes. Peppers. Radishes.  Fruits: Apples. Apricots. Avocado. Berries. Bananas. Cherries. Dates. Figs. Grapes. Lemons. Melon. Oranges. Peaches. Plums. Pomegranate.  Meats and other protein foods: Beans. Almonds. Sunflower seeds. Pine nuts. Peanuts. [place]. Salmon. Scallops. Shrimp. [place]. Tilapia. Clams. Oysters. Eggs.  Dairy: Low-fat milk. Cheese. Greek yogurt.  Beverages: Water. Red wine. Herbal tea.  Fats and oils: Extra virgin olive oil. Avocado oil. Grape seed oil.  Sweets and desserts: [place] yogurt with honey. Baked apples. Poached pears. Trail mix.  Seasoning and other foods: Basil. Cilantro. Coriander. Cumin. Mint. Parsley. Sage. Rosemary. Tarragon. Garlic. Oregano. Thyme. Pepper. Balsalmic vinegar. Tahini. Hummus. Tomato sauce. Olives. Mushrooms. ? Limit these  Grains: Prepackaged pasta or rice dishes. Prepackaged cereal with added sugar.  Vegetables: Deep fried potatoes (french fries).  Fruits: Fruit canned in syrup.  Meats and other protein foods: Beef. Pork. Lamb. Poultry with skin. Hot dogs. Berniece Salines.  Dairy: Ice cream. Sour cream. Whole milk.  Beverages: Juice. Sugar-sweetened soft drinks. Beer. Liquor and spirits.  Fats and oils: Butter. Canola oil. Vegetable oil. Beef fat (tallow). Lard.  Sweets and desserts: Cookies. Cakes. Pies. Candy.  Seasoning and other foods: Mayonnaise. Premade sauces and marinades. The items listed may not be a complete list. Talk with your dietitian about what dietary choices are right for you. Summary  The Mediterranean diet includes both food and lifestyle choices.  Eat a variety of fresh fruits and vegetables, beans, nuts, seeds, and whole grains.  Limit the amount of red meat and sweets that you eat.  Talk with your health care provider about whether it is safe for you to drink red  wine in moderation. This means 1 glass a day for nonpregnant women and 2 glasses a day for men. A glass of wine equals 5 oz (150 mL). This information is not intended to replace advice given to you by your health care provider. Make sure you discuss any questions you have with your health care provider. Document Released: 12/02/2015 Document Revised: 12/09/2015 Document Reviewed: 12/02/2015 Elsevier Patient Education  2020 [employer].

## 2018-10-22 ENCOUNTER — Telehealth: Payer: Self-pay | Admitting: Hematology & Oncology

## 2018-10-22 NOTE — Telephone Encounter (Signed)
lmom for patient to return call to office to confirm new patient appointment 7/16 at 130 pm

## 2018-11-07 ENCOUNTER — Other Ambulatory Visit: Payer: BLUE CROSS/BLUE SHIELD

## 2018-11-07 ENCOUNTER — Ambulatory Visit: Payer: BLUE CROSS/BLUE SHIELD | Admitting: Hematology & Oncology

## 2018-11-20 ENCOUNTER — Other Ambulatory Visit: Payer: Self-pay

## 2018-11-20 ENCOUNTER — Encounter: Payer: Self-pay | Admitting: Hematology & Oncology

## 2018-11-20 ENCOUNTER — Inpatient Hospital Stay (HOSPITAL_BASED_OUTPATIENT_CLINIC_OR_DEPARTMENT_OTHER): Payer: BC Managed Care – PPO | Admitting: Hematology & Oncology

## 2018-11-20 ENCOUNTER — Inpatient Hospital Stay: Payer: BC Managed Care – PPO | Attending: Hematology & Oncology

## 2018-11-20 ENCOUNTER — Other Ambulatory Visit: Payer: Self-pay | Admitting: *Deleted

## 2018-11-20 ENCOUNTER — Other Ambulatory Visit: Payer: Self-pay | Admitting: Oncology

## 2018-11-20 VITALS — BP 149/82 | HR 94 | Temp 97.5°F | Resp 19 | Ht 65.0 in | Wt 141.8 lb

## 2018-11-20 DIAGNOSIS — E538 Deficiency of other specified B group vitamins: Secondary | ICD-10-CM | POA: Insufficient documentation

## 2018-11-20 DIAGNOSIS — E785 Hyperlipidemia, unspecified: Secondary | ICD-10-CM | POA: Insufficient documentation

## 2018-11-20 DIAGNOSIS — I10 Essential (primary) hypertension: Secondary | ICD-10-CM | POA: Insufficient documentation

## 2018-11-20 DIAGNOSIS — D72819 Decreased white blood cell count, unspecified: Secondary | ICD-10-CM

## 2018-11-20 DIAGNOSIS — Z87891 Personal history of nicotine dependence: Secondary | ICD-10-CM

## 2018-11-20 DIAGNOSIS — Z79899 Other long term (current) drug therapy: Secondary | ICD-10-CM | POA: Insufficient documentation

## 2018-11-20 DIAGNOSIS — D4620 Refractory anemia with excess of blasts, unspecified: Secondary | ICD-10-CM

## 2018-11-20 DIAGNOSIS — D539 Nutritional anemia, unspecified: Secondary | ICD-10-CM | POA: Diagnosis not present

## 2018-11-20 DIAGNOSIS — Z7189 Other specified counseling: Secondary | ICD-10-CM | POA: Insufficient documentation

## 2018-11-20 HISTORY — DX: Refractory anemia with excess of blasts, unspecified: D46.20

## 2018-11-20 LAB — CMP (CANCER CENTER ONLY)
ALT: 11 U/L (ref 0–44)
AST: 16 U/L (ref 15–41)
Albumin: 4.3 g/dL (ref 3.5–5.0)
Alkaline Phosphatase: 66 U/L (ref 38–126)
Anion gap: 10 (ref 5–15)
BUN: 13 mg/dL (ref 8–23)
CO2: 26 mmol/L (ref 22–32)
Calcium: 9 mg/dL (ref 8.9–10.3)
Chloride: 103 mmol/L (ref 98–111)
Creatinine: 0.59 mg/dL (ref 0.44–1.00)
GFR, Est AFR Am: >60 mL/min (ref >60)
GFR, Estimated: >60 mL/min (ref >60)
Glucose, Bld: 183 mg/dL — ABNORMAL HIGH (ref 70–99)
Potassium: 4.2 mmol/L (ref 3.5–5.1)
Sodium: 139 mmol/L (ref 135–145)
Total Bilirubin: 0.4 mg/dL (ref 0.3–1.2)
Total Protein: 6.7 g/dL (ref 6.5–8.1)

## 2018-11-20 LAB — CBC WITH DIFFERENTIAL (CANCER CENTER ONLY)
Abs Immature Granulocytes: 0.01 10*3/uL (ref 0.00–0.07)
Basophils Absolute: 0.1 10*3/uL (ref 0.0–0.1)
Basophils Relative: 2 %
Eosinophils Absolute: 0.2 10*3/uL (ref 0.0–0.5)
Eosinophils Relative: 4 %
HCT: 30.8 % — ABNORMAL LOW (ref 36.0–46.0)
Hemoglobin: 10.3 g/dL — ABNORMAL LOW (ref 12.0–15.0)
Immature Granulocytes: 0 %
Lymphocytes Relative: 34 %
Lymphs Abs: 1.5 10*3/uL (ref 0.7–4.0)
MCH: 34.8 pg — ABNORMAL HIGH (ref 26.0–34.0)
MCHC: 33.4 g/dL (ref 30.0–36.0)
MCV: 104.1 fL — ABNORMAL HIGH (ref 80.0–100.0)
Monocytes Absolute: 0.4 10*3/uL (ref 0.1–1.0)
Monocytes Relative: 9 %
Neutro Abs: 2.2 10*3/uL (ref 1.7–7.7)
Neutrophils Relative %: 51 %
Platelet Count: 392 10*3/uL (ref 150–400)
RBC: 2.96 MIL/uL — ABNORMAL LOW (ref 3.87–5.11)
RDW: 19.3 % — ABNORMAL HIGH (ref 11.5–15.5)
WBC Count: 4.4 10*3/uL (ref 4.0–10.5)
nRBC: 0 % (ref 0.0–0.2)

## 2018-11-20 LAB — RETICULOCYTES
Immature Retic Fract: 11.7 % (ref 2.3–15.9)
RBC.: 2.96 MIL/uL — ABNORMAL LOW (ref 3.87–5.11)
Retic Count, Absolute: 29.6 10*3/uL (ref 19.0–186.0)
Retic Ct Pct: 1 % (ref 0.4–3.1)

## 2018-11-20 LAB — SAVE SMEAR(SSMR), FOR PROVIDER SLIDE REVIEW

## 2018-11-20 NOTE — Progress Notes (Signed)
Referral MD  Reason for Referral: Macrocytic anemia and transient leukopenia  Chief Complaint  Patient presents with  . New Patient (Initial Visit)  : I was told was anemic.  HPI: [name] is a very charming 73 year old white female.  She lives in [place].  She originally is from [place], [place].  She lived quite a while in [place].  She is followed by Dr.Gottschalk been [facility].  Dr. Lajuana Ripple, be incredibly thorough, realize there was some issues with her white cells and anemia.  Going back to October 2015, a CBC showed a white cell count of 3.8.  Hemoglobin 11.2.  Platelet count was not done.  Her MCV was 104.  In 2017, her white cell count was 4.1.  Hemoglobin 10.5.  Platelet count 391,000.  Her MCV is 108.  Apparently she was found to have B12 deficiency.  Back in 2015, a vitamin B12 level was 241.  She was on vitamin B12 injections.  She now is taking an oral vitamin B12 replacement.  Recently, [name]'s white cell count has been going down.  In March 2019, her white cell count was 3.7.  In June 2020 her white cell count was 3.2.  At that point, it was felt that a referral to hematology was indicated.  [name] has been healthy.  She still has her gynecologic organs.  She has had no bleeding.  She is had no infections.  Has had no weight loss.  She has not a vegetarian.  She used to smoke.  She stopped in 1984.  She has her routine mammograms every year.  She thinks her last colonoscopy was 3 years ago.  She has had no rashes.  She has had no swollen lymph nodes.  There is been really no change in her medications.  Overall, her performance status is ECOG 0.    Past Medical History:  Diagnosis Date  . Complication of anesthesia    slow to wake up  . Diverticulosis   . History of colon polyps   . HTN (hypertension), benign   . Migraine with aura   . Other and unspecified hyperlipidemia   . Tachycardia   . Vitamin B 12  deficiency   :  Past Surgical History:  Procedure Laterality Date  . ablation for tachacardia  2012  . BREAST CYST ASPIRATION  2000  . INGUINAL HERNIA REPAIR Right 02/05/2013   Procedure: HERNIA REPAIR femoral ADULT;  Surgeon: Shann Medal, MD;  Location: [facility];  Service: General;  Laterality: Right;  with MESH  . spinal block     for right hip / leg pain  :   Current Outpatient Medications:  .  Cholecalciferol (VITAMIN D3) 5000 UNITS TABS, Take 5,000 Units by mouth daily. , Disp: , Rfl:  .  Cyanocobalamin (B-12) 2500 MCG TABS, Take by mouth., Disp: , Rfl:  .  losartan (COZAAR) 50 MG tablet, Take 1 tablet (50 mg total) by mouth daily., Disp: 90 tablet, Rfl: 3 .  Melatonin 10 MG TABS, Take by mouth., Disp: , Rfl:  .  pravastatin (PRAVACHOL) 40 MG tablet, TAKE 1 & 1/2 TABLET AT BEDTIME, Disp: 135 tablet, Rfl: 3 .  pyridOXINE (VITAMIN B-6) 100 MG tablet, Take 100 mg by mouth daily., Disp: , Rfl:  .  UNABLE TO FIND, Med Name: Nerve shield plus, Disp: , Rfl:  .  UNABLE TO FIND, Med Name: CBD 28m, Disp: , Rfl: :  :  Allergies  Allergen Reactions  . Other  Other (See Comments)    Msg= cramps and diarrhea  :  Family History  Problem Relation Age of Onset  . Alzheimer's disease Mother   . Allergic rhinitis Mother   . Cancer Father        prostate  . Allergic rhinitis Father   . Allergic rhinitis Sister   :  Social History   Socioeconomic History  . Marital status: Married    Spouse name: Not on file  . Number of children: Not on file  . Years of education: Not on file  . Highest education level: Not on file  Occupational History  . Not on file  Social Needs  . Financial resource strain: Not on file  . Food insecurity    Worry: Not on file    Inability: Not on file  . Transportation needs    Medical: Not on file    Non-medical: Not on file  Tobacco Use  . Smoking status: Former Smoker    Packs/day: 1.00    Years: 10.00    Pack years: 10.00    Types:  Cigarettes    Quit date: 04/24/1982    Years since quitting: 36.6  . Smokeless tobacco: Never Used  Substance and Sexual Activity  . Alcohol use: Yes    Comment: 2 glass of wine per day  . Drug use: No  . Sexual activity: Yes    Partners: Male    Birth control/protection: Post-menopausal  Lifestyle  . Physical activity    Days per week: Not on file    Minutes per session: Not on file  . Stress: Not on file  Relationships  . Social Herbalist on phone: Not on file    Gets together: Not on file    Attends religious service: Not on file    Active member of club or organization: Not on file    Attends meetings of clubs or organizations: Not on file    Relationship status: Not on file  . Intimate partner violence    Fear of current or ex partner: Not on file    Emotionally abused: Not on file    Physically abused: Not on file    Forced sexual activity: Not on file  Other Topics Concern  . Not on file  Social History Narrative  . Not on file  :  Review of Systems  Constitutional: Negative.   HENT: Negative.   Eyes: Negative.   Respiratory: Negative.   Cardiovascular: Negative.   Gastrointestinal: Negative.   Genitourinary: Negative.   Musculoskeletal: Negative.   Skin: Negative.   Neurological: Negative.   Endo/Heme/Allergies: Negative.   Psychiatric/Behavioral: Negative.      Exam: Well-developed well-nourished white female in no obvious distress.  Vital signs are temperature of 97.5.  Pulse 94.  Blood pressure 149/82.  Weight is 141 pounds.  Head and neck exam shows no ocular or oral lesions.  She has no scleral icterus.  There is no adenopathy in the neck.  Thyroid is nonpalpable.  Lungs are clear bilaterally.  Cardiac exam regular rate and rhythm with no murmurs, rubs or bruits.  Abdomen is soft.  She has good bowel sounds.  There is no fluid wave.  There is no palpable liver or spleen tip.  Back exam shows no tenderness over the spine, ribs or hips.   Extremities shows no clubbing, cyanosis or edema.  Neurological exam shows no focal neurological deficits.  Skin exam shows no rashes, ecchymoses or petechia.  @  IPVITALS@   Recent Labs    11/20/18 1323  WBC 4.4  HGB 10.3*  HCT 30.8*  PLT 392   Recent Labs    11/20/18 1323  NA 139  K 4.2  CL 103  CO2 26  GLUCOSE 183*  BUN 13  CREATININE 0.59  CALCIUM 9.0    Blood smear review: Mild anisocytosis and poikilocytosis.  She has moderate macrocytic red blood cells.  She has no nucleated red blood cells.  She has no teardrop cells.  I see no spherocytes or schistocytes.  There is no rouleaux formation.  White blood cells are adequate number and size.  There are no hyper segmented polys.  She has no immature myeloid or lymphoid cells.  I see no blasts.  Platelets are adequate in number.  She has several large platelets.  Pathology: None    Assessment and Plan: Ms. Dahlem is a very nice 73 year old white female.  She has chronic macrocytic anemia.  Of note, her reticulocyte count is quite low.  I have to believe that Ms. Forster probably has an element of myelodysplasia.  Her renal function is okay.  She is on really no medications that would cause altered blood counts.  She is not diabetic.  It will be interesting to see what her erythropoietin level is.  I think this will dictate how we need to treat her.  She is asymptomatic right now.  Her hemoglobin is not that bad.  She has had this anemia for several years so I do not think that we really have to "jump in" and be too aggressive with trying to address the anemia.  I am not really worried about her white cells.  Her white cells are normal today.  She has a normal white cell differential.  Her blood smear did not show anything that looks like a hematologic malignancy.  Ultimately, I suspect she probably will need a bone marrow biopsy to prove that she has myelodysplasia.  However, I think we can hold off on this right now.  I  do not think we need to be invasive by any means.  I would like to see her back in about 6 weeks.  We will see what her erythropoietin level is.  If it is low for her degree of anemia, then we will consider using supplemental ESA to try to get her blood count up.  Again, she really does not symptomatic so I will think we have to pursue ESA for right now.  I spent 50 minutes with her. I reviewed the lab work.  I answered her questions.  I went over my recommendations.  She has a very good understanding of what we might be dealing with.  I gave her a prayer blanket.  She is very thankful for this.

## 2018-11-21 ENCOUNTER — Telehealth: Payer: Self-pay | Admitting: Hematology & Oncology

## 2018-11-21 LAB — ERYTHROPOIETIN: Erythropoietin: 86.5 m[IU]/mL — ABNORMAL HIGH (ref 2.6–18.5)

## 2018-11-21 LAB — LACTATE DEHYDROGENASE: LDH: 139 U/L (ref 98–192)

## 2018-11-21 NOTE — Telephone Encounter (Signed)
Called and advised patient of appointments added per 7/29 los

## 2018-12-11 DIAGNOSIS — Z1231 Encounter for screening mammogram for malignant neoplasm of breast: Secondary | ICD-10-CM | POA: Diagnosis not present

## 2019-01-01 ENCOUNTER — Encounter: Payer: Self-pay | Admitting: Hematology & Oncology

## 2019-01-01 ENCOUNTER — Inpatient Hospital Stay: Payer: BC Managed Care – PPO | Admitting: Hematology & Oncology

## 2019-01-01 ENCOUNTER — Other Ambulatory Visit: Payer: Self-pay

## 2019-01-01 ENCOUNTER — Inpatient Hospital Stay: Payer: BC Managed Care – PPO | Attending: Hematology & Oncology

## 2019-01-01 VITALS — BP 142/76 | HR 88 | Temp 98.0°F | Wt 141.0 lb

## 2019-01-01 DIAGNOSIS — Z79899 Other long term (current) drug therapy: Secondary | ICD-10-CM | POA: Diagnosis not present

## 2019-01-01 DIAGNOSIS — D649 Anemia, unspecified: Secondary | ICD-10-CM | POA: Diagnosis not present

## 2019-01-01 DIAGNOSIS — D4620 Refractory anemia with excess of blasts, unspecified: Secondary | ICD-10-CM

## 2019-01-01 DIAGNOSIS — E538 Deficiency of other specified B group vitamins: Secondary | ICD-10-CM

## 2019-01-01 LAB — CBC WITH DIFFERENTIAL (CANCER CENTER ONLY)
Abs Immature Granulocytes: 0.05 10*3/uL (ref 0.00–0.07)
Basophils Absolute: 0.1 10*3/uL (ref 0.0–0.1)
Basophils Relative: 2 %
Eosinophils Absolute: 0.2 10*3/uL (ref 0.0–0.5)
Eosinophils Relative: 5 %
HCT: 30.1 % — ABNORMAL LOW (ref 36.0–46.0)
Hemoglobin: 10.1 g/dL — ABNORMAL LOW (ref 12.0–15.0)
Immature Granulocytes: 1 %
Lymphocytes Relative: 24 %
Lymphs Abs: 1.1 10*3/uL (ref 0.7–4.0)
MCH: 34.9 pg — ABNORMAL HIGH (ref 26.0–34.0)
MCHC: 33.6 g/dL (ref 30.0–36.0)
MCV: 104.2 fL — ABNORMAL HIGH (ref 80.0–100.0)
Monocytes Absolute: 0.7 10*3/uL (ref 0.1–1.0)
Monocytes Relative: 15 %
Neutro Abs: 2.4 10*3/uL (ref 1.7–7.7)
Neutrophils Relative %: 53 %
Platelet Count: 383 10*3/uL (ref 150–400)
RBC: 2.89 MIL/uL — ABNORMAL LOW (ref 3.87–5.11)
RDW: 19.5 % — ABNORMAL HIGH (ref 11.5–15.5)
WBC Count: 4.5 10*3/uL (ref 4.0–10.5)
nRBC: 0 % (ref 0.0–0.2)

## 2019-01-01 LAB — CMP (CANCER CENTER ONLY)
ALT: 14 U/L (ref 0–44)
AST: 17 U/L (ref 15–41)
Albumin: 4.5 g/dL (ref 3.5–5.0)
Alkaline Phosphatase: 67 U/L (ref 38–126)
Anion gap: 6 (ref 5–15)
BUN: 13 mg/dL (ref 8–23)
CO2: 29 mmol/L (ref 22–32)
Calcium: 9.4 mg/dL (ref 8.9–10.3)
Chloride: 104 mmol/L (ref 98–111)
Creatinine: 0.62 mg/dL (ref 0.44–1.00)
GFR, Est AFR Am: >60 mL/min (ref >60)
GFR, Estimated: >60 mL/min (ref >60)
Glucose, Bld: 111 mg/dL — ABNORMAL HIGH (ref 70–99)
Potassium: 3.9 mmol/L (ref 3.5–5.1)
Sodium: 139 mmol/L (ref 135–145)
Total Bilirubin: 0.4 mg/dL (ref 0.3–1.2)
Total Protein: 6.9 g/dL (ref 6.5–8.1)

## 2019-01-01 LAB — RETICULOCYTES
Immature Retic Fract: 11.8 % (ref 2.3–15.9)
RBC.: 2.9 MIL/uL — ABNORMAL LOW (ref 3.87–5.11)
Retic Count, Absolute: 32.5 10*3/uL (ref 19.0–186.0)
Retic Ct Pct: 1.1 % (ref 0.4–3.1)

## 2019-01-01 LAB — SAVE SMEAR(SSMR), FOR PROVIDER SLIDE REVIEW

## 2019-01-01 NOTE — Progress Notes (Signed)
Hematology and Oncology Follow Up Visit  Emily Doyle [id number] 10-24-1945 73 y.o. 01/01/2019   Principle Diagnosis:   Macrocytic anemia-likely myelodysplasia  Current Therapy:    Observation     Interim History:  Emily Doyle is back for second office visit.  She is doing okay.  She is totally asymptomatic with respect to her anemia.  Her work-up to date, has really not shown anything specific.  She does have a relatively low reticulocyte count.  Her erythropoietin level is 86.  Again I suspect that she probably has low-grade myelodysplasia.  I suspect she probably has refractory anemia.  She is asymptomatic.  We will just have to follow her along for right now.  I am sending off a myelodysplastic NGS panel.  This will give [place] an idea as to what the future might hold for her.  She is had no fever.  She has had no rashes.  There is been no nausea or vomiting.  She has had no change in bowel or bladder habits.  She has had no headache.  Overall, I would have to say that her performance status is ECOG 0.  Medications:  Current Outpatient Medications:  .  Cholecalciferol (VITAMIN D3) 5000 UNITS TABS, Take 5,000 Units by mouth daily. , Disp: , Rfl:  .  Cyanocobalamin (B-12) 2500 MCG TABS, Take by mouth., Disp: , Rfl:  .  losartan (COZAAR) 50 MG tablet, Take 1 tablet (50 mg total) by mouth daily., Disp: 90 tablet, Rfl: 3 .  Melatonin 10 MG TABS, Take by mouth., Disp: , Rfl:  .  pravastatin (PRAVACHOL) 40 MG tablet, TAKE 1 & 1/2 TABLET AT BEDTIME, Disp: 135 tablet, Rfl: 3 .  pyridOXINE (VITAMIN B-6) 100 MG tablet, Take 100 mg by mouth daily., Disp: , Rfl:  .  UNABLE TO FIND, Med Name: Nerve shield plus, Disp: , Rfl:  .  UNABLE TO FIND, Med Name: CBD 24m, Disp: , Rfl:   Allergies:  Allergies  Allergen Reactions  . Other Other (See Comments)    Msg= cramps and diarrhea    Past Medical History, Surgical history, Social history, and Family History were reviewed and  updated.  Review of Systems: Review of Systems  Constitutional: Negative.   HENT:  Negative.   Eyes: Negative.   Respiratory: Negative.   Cardiovascular: Negative.   Gastrointestinal: Negative.   Endocrine: Negative.   Genitourinary: Negative.    Musculoskeletal: Negative.   Skin: Negative.   Neurological: Negative.   Hematological: Negative.   Psychiatric/Behavioral: Negative.     Physical Exam:  weight is 141 lb (64 kg). Her oral temperature is 98 F (36.7 C). Her blood pressure is 142/76 (abnormal) and her pulse is 88. Her oxygen saturation is 100%.   Wt Readings from Last 3 Encounters:  01/01/19 141 lb (64 kg)  11/20/18 141 lb 12.8 oz (64.3 kg)  10/21/18 140 lb (63.5 kg)    Physical Exam Vitals signs reviewed.  HENT:     Head: Normocephalic and atraumatic.  Eyes:     Pupils: Pupils are equal, round, and reactive to light.  Neck:     Musculoskeletal: Normal range of motion.  Cardiovascular:     Rate and Rhythm: Normal rate and regular rhythm.     Heart sounds: Normal heart sounds.  Pulmonary:     Effort: Pulmonary effort is normal.     Breath sounds: Normal breath sounds.  Abdominal:     General: Bowel sounds are normal.  Palpations: Abdomen is soft.  Musculoskeletal: Normal range of motion.        General: No tenderness or deformity.  Lymphadenopathy:     Cervical: No cervical adenopathy.  Skin:    General: Skin is warm and dry.     Findings: No erythema or rash.  Neurological:     Mental Status: She is alert and oriented to person, place, and time.  Psychiatric:        Behavior: Behavior normal.        Thought Content: Thought content normal.        Judgment: Judgment normal.      Lab Results  Component Value Date   WBC 4.5 01/01/2019   HGB 10.1 (L) 01/01/2019   HCT 30.1 (L) 01/01/2019   MCV 104.2 (H) 01/01/2019   PLT 383 01/01/2019     Chemistry      Component Value Date/Time   NA 139 01/01/2019 1104   NA 142 10/16/2018 0817   K 3.9  01/01/2019 1104   CL 104 01/01/2019 1104   CO2 29 01/01/2019 1104   BUN 13 01/01/2019 1104   BUN 13 10/16/2018 0817   CREATININE 0.62 01/01/2019 1104      Component Value Date/Time   CALCIUM 9.4 01/01/2019 1104   ALKPHOS 67 01/01/2019 1104   AST 17 01/01/2019 1104   ALT 14 01/01/2019 1104   BILITOT 0.4 01/01/2019 1104       Impression and Plan: Emily Doyle is a 73-year-old white female.  She has had longstanding anemia.  This is been mild.  Again she is totally asymptomatic with this.  She is macrocytic.  I looked at her blood smear.  I really was not too impressed with what I saw.  I do not see any nucleated red blood cells.  She had no significant anisocytosis or poikilocytosis.  Again, I suspect that at some point if her anemia worsens, we will consider a bone marrow biopsy for her.  I am sure this will tell us what is going on.  The myelodysplastic NGS panel also would be helpful.  I will plan to get her back to see us in another 4 months.  I think we can probably get her through the holidays.  If there is something unusual with respect to her NGS panel, then we can get her back sooner.  I spent about 35 minutes with her.  She is having some problems with her insurance company that we may have to help with.   Peter R Ennever, MD 9/9/20205:06 PM 

## 2019-01-02 ENCOUNTER — Telehealth: Payer: Self-pay | Admitting: Hematology & Oncology

## 2019-01-02 LAB — IRON AND TIBC
Iron: 111 ug/dL (ref 41–142)
Saturation Ratios: 40 % (ref 21–57)
TIBC: 280 ug/dL (ref 236–444)
UIBC: 169 ug/dL (ref 120–384)

## 2019-01-02 LAB — FERRITIN: Ferritin: 130 ng/mL (ref 11–307)

## 2019-01-02 NOTE — Telephone Encounter (Signed)
lmom for patient Jan 2021 appts per 9/9 LOS

## 2019-01-03 LAB — ERYTHROPOIETIN: Erythropoietin: 89 m[IU]/mL — ABNORMAL HIGH (ref 2.6–18.5)

## 2019-01-31 LAB — MYELODYSPLASTIC SYNDROME (MDS) PANEL

## 2019-02-27 ENCOUNTER — Other Ambulatory Visit: Payer: Self-pay

## 2019-02-27 DIAGNOSIS — Z20822 Contact with and (suspected) exposure to covid-19: Secondary | ICD-10-CM

## 2019-02-28 LAB — NOVEL CORONAVIRUS, NAA: SARS-CoV-2, NAA: NOT DETECTED

## 2019-04-30 ENCOUNTER — Inpatient Hospital Stay: Payer: BC Managed Care – PPO

## 2019-04-30 ENCOUNTER — Inpatient Hospital Stay: Payer: BC Managed Care – PPO | Attending: Hematology & Oncology | Admitting: Hematology & Oncology

## 2019-05-06 ENCOUNTER — Telehealth: Payer: Self-pay | Admitting: Family Medicine

## 2019-05-06 ENCOUNTER — Other Ambulatory Visit: Payer: Self-pay | Admitting: Family Medicine

## 2019-05-06 DIAGNOSIS — D4620 Refractory anemia with excess of blasts, unspecified: Secondary | ICD-10-CM

## 2019-05-06 NOTE — Telephone Encounter (Signed)
Referral placed.

## 2019-05-06 NOTE — Telephone Encounter (Signed)
REFERRAL REQUEST Telephone Note  What type of referral do you need? Hematology  Have you been seen at our office for this problem?Yes - Have discussed bloodwork with Dr. Lajuana Ripple (Advise that they will likely need an appointment with their PCP before a referral can be done)  Is there a particular doctor or location that you prefer? [facility]  Patient notified that referrals can take up to a week or longer to process. If they haven't heard anything within a week they should call back and speak with the referral department.

## 2019-06-17 DIAGNOSIS — H5203 Hypermetropia, bilateral: Secondary | ICD-10-CM | POA: Diagnosis not present

## 2019-06-17 DIAGNOSIS — H2513 Age-related nuclear cataract, bilateral: Secondary | ICD-10-CM | POA: Diagnosis not present

## 2019-06-17 DIAGNOSIS — H353132 Nonexudative age-related macular degeneration, bilateral, intermediate dry stage: Secondary | ICD-10-CM | POA: Diagnosis not present

## 2019-06-25 DIAGNOSIS — R7989 Other specified abnormal findings of blood chemistry: Secondary | ICD-10-CM | POA: Diagnosis not present

## 2019-06-25 DIAGNOSIS — D539 Nutritional anemia, unspecified: Secondary | ICD-10-CM | POA: Diagnosis not present

## 2019-06-25 DIAGNOSIS — D469 Myelodysplastic syndrome, unspecified: Secondary | ICD-10-CM | POA: Diagnosis not present

## 2019-06-25 DIAGNOSIS — D649 Anemia, unspecified: Secondary | ICD-10-CM | POA: Diagnosis not present

## 2019-07-09 DIAGNOSIS — D469 Myelodysplastic syndrome, unspecified: Secondary | ICD-10-CM | POA: Diagnosis not present

## 2019-07-09 DIAGNOSIS — D649 Anemia, unspecified: Secondary | ICD-10-CM | POA: Diagnosis not present

## 2019-07-15 ENCOUNTER — Other Ambulatory Visit: Payer: Self-pay | Admitting: Family Medicine

## 2019-08-28 DIAGNOSIS — D649 Anemia, unspecified: Secondary | ICD-10-CM | POA: Diagnosis not present

## 2019-10-28 ENCOUNTER — Other Ambulatory Visit: Payer: Self-pay | Admitting: Family Medicine

## 2019-10-29 MED ORDER — LOSARTAN POTASSIUM 50 MG PO TABS: 50.0000 mg | ORAL_TABLET | Freq: Every day | ORAL | 0 refills | Status: DC

## 2019-10-29 NOTE — Telephone Encounter (Signed)
Gottschalk. NTBS LOV 10/21/18

## 2019-10-29 NOTE — Addendum Note (Signed)
Addended by: Ladean Raya on: 10/29/2019 03:59 PM   Modules accepted: Orders

## 2019-10-29 NOTE — Telephone Encounter (Signed)
Appt made

## 2019-11-12 ENCOUNTER — Other Ambulatory Visit: Payer: Self-pay | Admitting: Family Medicine

## 2019-11-12 DIAGNOSIS — E7800 Pure hypercholesterolemia, unspecified: Secondary | ICD-10-CM

## 2019-11-26 ENCOUNTER — Other Ambulatory Visit: Payer: Self-pay | Admitting: Family Medicine

## 2019-12-02 ENCOUNTER — Ambulatory Visit: Payer: BC Managed Care – PPO | Admitting: Family Medicine

## 2019-12-02 ENCOUNTER — Other Ambulatory Visit: Payer: Self-pay

## 2019-12-02 VITALS — BP 139/88 | HR 96 | Temp 97.1°F | Ht 65.0 in | Wt 141.0 lb

## 2019-12-02 DIAGNOSIS — E7800 Pure hypercholesterolemia, unspecified: Secondary | ICD-10-CM

## 2019-12-02 DIAGNOSIS — D4620 Refractory anemia with excess of blasts, unspecified: Secondary | ICD-10-CM

## 2019-12-02 DIAGNOSIS — Z23 Encounter for immunization: Secondary | ICD-10-CM | POA: Diagnosis not present

## 2019-12-02 DIAGNOSIS — E538 Deficiency of other specified B group vitamins: Secondary | ICD-10-CM | POA: Diagnosis not present

## 2019-12-02 DIAGNOSIS — I10 Essential (primary) hypertension: Secondary | ICD-10-CM

## 2019-12-02 DIAGNOSIS — F5101 Primary insomnia: Secondary | ICD-10-CM

## 2019-12-02 MED ORDER — PRAVASTATIN SODIUM 40 MG PO TABS: ORAL_TABLET | ORAL | 3 refills | Status: DC

## 2019-12-02 MED ORDER — LOSARTAN POTASSIUM 50 MG PO TABS: 50.0000 mg | ORAL_TABLET | Freq: Every day | ORAL | 3 refills | Status: DC

## 2019-12-02 NOTE — Addendum Note (Signed)
Addended byCarrolyn Leigh on: 12/02/2019 02:25 PM   Modules accepted: Orders

## 2019-12-02 NOTE — Progress Notes (Signed)
Subjective: CC: Hypertension w/ HLD; low grade myelodysplastic syndrome PCP: Emily Norlander, DO CVE:LFYBO Emily Doyle is a 74 y.o. female presenting to clinic today for:  1.  Hypertension and hyperlipidemia Patient compliant with Cozaar 50 mg daily and Pravachol 60 mg nightly.  She is physically active and tries to maintain a balanced diet with plenty of fruits and vegetables.  No chest pain, shortness of breath, edema or falls  2.  Insomnia Patient uses melatonin and sometimes a half a tablet of Aleve PM at bedtime for sleep.  She does rest well and up to 10 hours with this but wanted to make sure that this was okay given the fact that she is over 65 and it contains diphenhydramine.  3.  Low-grade myelodysplastic syndrome.   Patient is being followed by a hematologist at [facility] for this.  She notes her next appointment will be in September.  Thus far, she has been in relatively good health and no interventions have been needed.  She is not required a bone marrow biopsy because her levels "went in the right direction".   ROS: Per HPI  Allergies  Allergen Reactions  . Other Other (See Comments)    Msg= cramps and diarrhea   Past Medical History:  Diagnosis Date  . Complication of anesthesia    slow to wake up  . Diverticulosis   . History of colon polyps   . HTN (hypertension), benign   . Low grade myelodysplastic syndrome lesions ([place]) 11/20/2018  . Migraine with aura   . Other and unspecified hyperlipidemia   . Tachycardia   . Vitamin B 12 deficiency     Current Outpatient Medications:  .  Cholecalciferol (VITAMIN D3) 5000 UNITS TABS, Take 5,000 Units by mouth daily. , Disp: , Rfl:  .  Cyanocobalamin (B-12) 2500 MCG TABS, Take by mouth., Disp: , Rfl:  .  losartan (COZAAR) 50 MG tablet, Take 1 tablet (50 mg total) by mouth daily. Needs to be seen before next refill., Disp: 30 tablet, Rfl: 0 .  Melatonin 10 MG TABS, Take by mouth., Disp: , Rfl:  .   pravastatin (PRAVACHOL) 40 MG tablet, TAKE 1 & 1/2 TABLET AT BEDTIME, Disp: 45 tablet, Rfl: 0 .  pyridOXINE (VITAMIN B-6) 100 MG tablet, Take 100 mg by mouth daily., Disp: , Rfl:  .  UNABLE TO FIND, Med Name: Nerve shield plus, Disp: , Rfl:  .  UNABLE TO FIND, Med Name: CBD 21m, Disp: , Rfl:  Social History   Socioeconomic History  . Marital status: Married    Spouse name: Not on file  . Number of children: Not on file  . Years of education: Not on file  . Highest education level: Not on file  Occupational History  . Not on file  Tobacco Use  . Smoking status: Former Smoker    Packs/day: 1.00    Years: 10.00    Pack years: 10.00    Types: Cigarettes    Quit date: 04/24/1982    Years since quitting: 37.6  . Smokeless tobacco: Never Used  Vaping Use  . Vaping Use: Never used  Substance and Sexual Activity  . Alcohol use: Yes    Comment: 2 glass of wine per day  . Drug use: No  . Sexual activity: Yes    Partners: Male    Birth control/protection: Post-menopausal  Other Topics Concern  . Not on file  Social History Narrative  . Not on file  Social Determinants of Health   Financial Resource Strain:   . Difficulty of Paying Living Expenses:   Food Insecurity:   . Worried About Charity fundraiser in the Last Year:   . Arboriculturist in the Last Year:   Transportation Needs:   . Film/video editor (Medical):   Marland Kitchen Lack of Transportation (Non-Medical):   Physical Activity:   . Days of Exercise per Week:   . Minutes of Exercise per Session:   Stress:   . Feeling of Stress :   Social Connections:   . Frequency of Communication with Friends and Family:   . Frequency of Social Gatherings with Friends and Family:   . Attends Religious Services:   . Active Member of Clubs or Organizations:   . Attends Archivist Meetings:   Marland Kitchen Marital Status:   Intimate Partner Violence:   . Fear of Current or Ex-Partner:   . Emotionally Abused:   Marland Kitchen Physically Abused:   .  Sexually Abused:    Family History  Problem Relation Age of Onset  . Alzheimer's disease Mother   . Allergic rhinitis Mother   . Cancer Father        prostate  . Allergic rhinitis Father   . Allergic rhinitis Sister     Objective: Office vital signs reviewed. BP 139/88   Pulse 96   Temp (!) 97.1 Emily (36.2 C) (Temporal)   Ht '5\' 5"'  (1.651 m)   Wt 141 lb (64 kg)   SpO2 97%   BMI 23.46 kg/m   Physical Examination:  General: Awake, alert, well nourished, No acute distress HEENT: Normal    Neck: No masses palpated. No lymphadenopathy Cardio: regular rate and rhythm, S1S2 heard, no murmurs appreciated Pulm: clear to auscultation bilaterally, no wheezes, rhonchi or rales; normal work of breathing on room air GI: Flat, soft, nontender.  No hepatosplenomegaly or abdominal masses Extremities: warm, well perfused, No edema, cyanosis or clubbing; +2 pulses bilaterally MSK: normal gait and station  Assessment/ Plan: 74 y.o. female   1. HTN (hypertension), benign Well-controlled.  Continue current regimen - CMP14+EGFR; Future  2. Pure hypercholesterolemia Stable.  She will come in for fasting labs - CMP14+EGFR; Future - Lipid Panel; Future - TSH; Future - pravastatin (PRAVACHOL) 40 MG tablet; Take 1.5 tablets qhs  Dispense: 135 tablet; Refill: 3  3. Low grade myelodysplastic syndrome (HCC) Currently being monitored and managed by hematology. - VITAMIN D 25 Hydroxy (Vit-D Deficiency, Fractures); Future  4. Vitamin B 12 deficiency She would like a repeat vitamin B12.  She has neuropathy and is currently self treating with OTC nerve medication - Vitamin B12; Future  5. Primary insomnia Okay to use Aleve PM sparingly.  We discussed the possible side effects and risks associated with diphenhydramine use.  She voiced good understanding will follow up as needed    No orders of the defined types were placed in this encounter.  No orders of the defined types were placed in this  encounter.    Emily Norlander, DO [place] [phone]

## 2019-12-03 ENCOUNTER — Other Ambulatory Visit: Payer: BC Managed Care – PPO

## 2019-12-03 DIAGNOSIS — I10 Essential (primary) hypertension: Secondary | ICD-10-CM

## 2019-12-03 DIAGNOSIS — D4620 Refractory anemia with excess of blasts, unspecified: Secondary | ICD-10-CM

## 2019-12-03 DIAGNOSIS — E538 Deficiency of other specified B group vitamins: Secondary | ICD-10-CM

## 2019-12-03 DIAGNOSIS — E7800 Pure hypercholesterolemia, unspecified: Secondary | ICD-10-CM

## 2019-12-04 LAB — LIPID PANEL
Chol/HDL Ratio: 1.8 ratio (ref 0.0–4.4)
Cholesterol, Total: 186 mg/dL (ref 100–199)
HDL: 105 mg/dL (ref >39)
LDL Chol Calc (NIH): 69 mg/dL (ref 0–99)
Triglycerides: 64 mg/dL (ref 0–149)
VLDL Cholesterol Cal: 12 mg/dL (ref 5–40)

## 2019-12-04 LAB — CMP14+EGFR
ALT: 11 IU/L (ref 0–32)
AST: 17 IU/L (ref 0–40)
Albumin/Globulin Ratio: 2.2 (ref 1.2–2.2)
Albumin: 4.6 g/dL (ref 3.7–4.7)
Alkaline Phosphatase: 76 IU/L (ref 48–121)
BUN/Creatinine Ratio: 18 (ref 12–28)
BUN: 11 mg/dL (ref 8–27)
Bilirubin Total: 0.5 mg/dL (ref 0.0–1.2)
CO2: 21 mmol/L (ref 20–29)
Calcium: 9.6 mg/dL (ref 8.7–10.3)
Chloride: 105 mmol/L (ref 96–106)
Creatinine, Ser: 0.62 mg/dL (ref 0.57–1.00)
GFR calc Af Amer: 103 mL/min/{1.73_m2} (ref >59)
GFR calc non Af Amer: 89 mL/min/{1.73_m2} (ref >59)
Globulin, Total: 2.1 g/dL (ref 1.5–4.5)
Glucose: 95 mg/dL (ref 65–99)
Potassium: 4.6 mmol/L (ref 3.5–5.2)
Sodium: 141 mmol/L (ref 134–144)
Total Protein: 6.7 g/dL (ref 6.0–8.5)

## 2019-12-04 LAB — VITAMIN B12: Vitamin B-12: 1329 pg/mL — ABNORMAL HIGH (ref 232–1245)

## 2019-12-04 LAB — TSH: TSH: 1.16 u[IU]/mL (ref 0.450–4.500)

## 2019-12-04 LAB — VITAMIN D 25 HYDROXY (VIT D DEFICIENCY, FRACTURES): Vit D, 25-Hydroxy: 63.1 ng/mL (ref 30.0–100.0)

## 2020-01-01 DIAGNOSIS — D649 Anemia, unspecified: Secondary | ICD-10-CM | POA: Diagnosis not present

## 2020-01-01 DIAGNOSIS — D72819 Decreased white blood cell count, unspecified: Secondary | ICD-10-CM | POA: Diagnosis not present

## 2020-01-13 ENCOUNTER — Ambulatory Visit (INDEPENDENT_AMBULATORY_CARE_PROVIDER_SITE_OTHER): Payer: BC Managed Care – PPO | Admitting: Nurse Practitioner

## 2020-01-13 DIAGNOSIS — R300 Dysuria: Secondary | ICD-10-CM

## 2020-01-13 DIAGNOSIS — N3000 Acute cystitis without hematuria: Secondary | ICD-10-CM | POA: Diagnosis not present

## 2020-01-13 LAB — MICROSCOPIC EXAMINATION: RBC, Urine: NONE SEEN /hpf (ref 0–2)

## 2020-01-13 LAB — URINALYSIS, COMPLETE
Bilirubin, UA: NEGATIVE
Glucose, UA: NEGATIVE
Ketones, UA: NEGATIVE
Nitrite, UA: NEGATIVE
Specific Gravity, UA: >1.03 — ABNORMAL HIGH (ref 1.005–1.030)
Urobilinogen, Ur: 0.2 mg/dL (ref 0.2–1.0)
pH, UA: 5.5 (ref 5.0–7.5)

## 2020-01-13 MED ORDER — CEPHALEXIN 500 MG PO CAPS: 500.0000 mg | ORAL_CAPSULE | Freq: Two times a day (BID) | ORAL | 0 refills | Status: DC

## 2020-01-13 NOTE — Progress Notes (Signed)
   Virtual Visit via telephone Note Due to COVID-19 pandemic this visit was conducted virtually. This visit type was conducted due to national recommendations for restrictions regarding the COVID-19 Pandemic (e.g. social distancing, sheltering in place) in an effort to limit this patient's exposure and mitigate transmission in our community. All issues noted in this document were discussed and addressed.  A physical exam was not performed with this format.  I connected with Emily Doyle on 01/13/20 at 1:10 by telephone and verified that I am speaking with the correct person using two identifiers. Emily Doyle is currently located at home and no one is currently with  her during visit. The provider, Mary-Margaret Hassell Done, FNP is located in their office at time of visit.  I discussed the limitations, risks, security and privacy concerns of performing an evaluation and management service by telephone and the availability of in person appointments. I also discussed with the patient that there may be a patient responsible charge related to this service. The patient expressed understanding and agreed to proceed.   History and Present Illness:   Chief Complaint: Urinary Tract Infection   HPI Patient calls in for a telephone appointment c/o dysuria- started about 1 weeek ago. Has been taking AZO OTC which has helped with symptoms some but symptoms have not completely resolved.  * patient brought in urine specimen.  ROS   Observations/Objective: Alert and oriented- answers all questions appropriately No distress No suprapubic pain and no back pain  Assessment and Plan: Emily Doyle in today with chief complaint of Urinary Tract Infection   1. Dysuria - Urinalysis, Complete - Microscopic Examination  2. Acute cystitis without hematuria Take medication as prescribe Cotton underwear Take shower not bath Cranberry juice, yogurt Force fluids AZO over the counter X2  days Culture pending RTO prn  - cephALEXin (KEFLEX) 500 MG capsule; Take 1 capsule (500 mg total) by mouth 2 (two) times daily.  Dispense: 14 capsule; Refill: 0 - Urine Culture     Follow Up Instructions: prn    I discussed the assessment and treatment plan with the patient. The patient was provided an opportunity to ask questions and all were answered. The patient agreed with the plan and demonstrated an understanding of the instructions.   The patient was advised to call back or seek an in-person evaluation if the symptoms worsen or if the condition fails to improve as anticipated.  The above assessment and management plan was discussed with the patient. The patient verbalized understanding of and has agreed to the management plan. Patient is aware to call the clinic if symptoms persist or worsen. Patient is aware when to return to the clinic for a follow-up visit. Patient educated on when it is appropriate to go to the emergency department.   Time call ended:  1:25  I provided 17 minutes of non-face-to-face time during this encounter.    Mary-Margaret Hassell Done, FNP

## 2020-01-17 LAB — URINE CULTURE

## 2020-01-19 ENCOUNTER — Encounter: Payer: Self-pay | Admitting: Family Medicine

## 2020-01-20 ENCOUNTER — Telehealth: Payer: Self-pay | Admitting: Family Medicine

## 2020-01-20 ENCOUNTER — Other Ambulatory Visit: Payer: Self-pay | Admitting: Nurse Practitioner

## 2020-01-20 ENCOUNTER — Other Ambulatory Visit: Payer: BC Managed Care – PPO

## 2020-01-20 ENCOUNTER — Other Ambulatory Visit: Payer: Self-pay

## 2020-01-20 DIAGNOSIS — R300 Dysuria: Secondary | ICD-10-CM | POA: Diagnosis not present

## 2020-01-20 LAB — URINALYSIS, COMPLETE
Bilirubin, UA: NEGATIVE
Glucose, UA: NEGATIVE
Leukocytes,UA: NEGATIVE
Nitrite, UA: NEGATIVE
Protein,UA: NEGATIVE
RBC, UA: NEGATIVE
Specific Gravity, UA: >1.03 — ABNORMAL HIGH (ref 1.005–1.030)
Urobilinogen, Ur: 0.2 mg/dL (ref 0.2–1.0)
pH, UA: 5.5 (ref 5.0–7.5)

## 2020-01-20 LAB — MICROSCOPIC EXAMINATION
Bacteria, UA: NONE SEEN
RBC, Urine: NONE SEEN /hpf (ref 0–2)

## 2020-01-20 NOTE — Telephone Encounter (Signed)
°  Incoming Patient Call  01/20/2020  What symptoms do you have? Pt still feels yucky and feels burning with urination   How long have you been sick? A week  Have you been seen for this problem?   If your provider decides to give you a prescription, which pharmacy would you like for it to be sent to? Pt had televisit with [facility]MM on 01/13/20 has finished taking antibiotic but does not feel 100% good would like order to come in to leave another urine sample    Patient informed that this information will be sent to the clinical staff for review and that they should receive a follow up call.

## 2020-02-10 DIAGNOSIS — R350 Frequency of micturition: Secondary | ICD-10-CM | POA: Diagnosis not present

## 2020-02-10 DIAGNOSIS — R300 Dysuria: Secondary | ICD-10-CM | POA: Diagnosis not present

## 2020-02-10 DIAGNOSIS — R34 Anuria and oliguria: Secondary | ICD-10-CM | POA: Diagnosis not present

## 2020-03-15 DIAGNOSIS — Z1231 Encounter for screening mammogram for malignant neoplasm of breast: Secondary | ICD-10-CM | POA: Diagnosis not present

## 2020-03-30 DIAGNOSIS — R922 Inconclusive mammogram: Secondary | ICD-10-CM | POA: Diagnosis not present

## 2020-03-30 DIAGNOSIS — R928 Other abnormal and inconclusive findings on diagnostic imaging of breast: Secondary | ICD-10-CM | POA: Diagnosis not present

## 2020-03-30 DIAGNOSIS — N6489 Other specified disorders of breast: Secondary | ICD-10-CM | POA: Diagnosis not present

## 2020-04-07 DIAGNOSIS — D72819 Decreased white blood cell count, unspecified: Secondary | ICD-10-CM | POA: Diagnosis not present

## 2020-04-07 DIAGNOSIS — D649 Anemia, unspecified: Secondary | ICD-10-CM | POA: Diagnosis not present

## 2020-04-20 DIAGNOSIS — Z170 Estrogen receptor positive status [ER+]: Secondary | ICD-10-CM | POA: Diagnosis not present

## 2020-04-20 DIAGNOSIS — R920 Mammographic microcalcification found on diagnostic imaging of breast: Secondary | ICD-10-CM | POA: Diagnosis not present

## 2020-04-20 DIAGNOSIS — R928 Other abnormal and inconclusive findings on diagnostic imaging of breast: Secondary | ICD-10-CM | POA: Diagnosis not present

## 2020-04-20 DIAGNOSIS — N6032 Fibrosclerosis of left breast: Secondary | ICD-10-CM | POA: Diagnosis not present

## 2020-04-20 DIAGNOSIS — N6489 Other specified disorders of breast: Secondary | ICD-10-CM | POA: Diagnosis not present

## 2020-04-20 DIAGNOSIS — D0512 Intraductal carcinoma in situ of left breast: Secondary | ICD-10-CM | POA: Diagnosis not present

## 2020-04-28 DIAGNOSIS — D0512 Intraductal carcinoma in situ of left breast: Secondary | ICD-10-CM | POA: Diagnosis not present

## 2020-05-04 DIAGNOSIS — D0512 Intraductal carcinoma in situ of left breast: Secondary | ICD-10-CM | POA: Diagnosis not present

## 2020-05-06 DIAGNOSIS — R928 Other abnormal and inconclusive findings on diagnostic imaging of breast: Secondary | ICD-10-CM | POA: Diagnosis not present

## 2020-05-06 DIAGNOSIS — D0512 Intraductal carcinoma in situ of left breast: Secondary | ICD-10-CM | POA: Diagnosis not present

## 2020-05-11 DIAGNOSIS — N6489 Other specified disorders of breast: Secondary | ICD-10-CM | POA: Diagnosis not present

## 2020-05-11 DIAGNOSIS — N6022 Fibroadenosis of left breast: Secondary | ICD-10-CM | POA: Diagnosis not present

## 2020-05-11 DIAGNOSIS — D0512 Intraductal carcinoma in situ of left breast: Secondary | ICD-10-CM | POA: Diagnosis not present

## 2020-05-11 DIAGNOSIS — N6082 Other benign mammary dysplasias of left breast: Secondary | ICD-10-CM | POA: Diagnosis not present

## 2020-05-25 DIAGNOSIS — D649 Anemia, unspecified: Secondary | ICD-10-CM | POA: Diagnosis not present

## 2020-05-25 DIAGNOSIS — D0512 Intraductal carcinoma in situ of left breast: Secondary | ICD-10-CM | POA: Diagnosis not present

## 2020-05-25 DIAGNOSIS — C50812 Malignant neoplasm of overlapping sites of left female breast: Secondary | ICD-10-CM | POA: Diagnosis not present

## 2020-05-25 DIAGNOSIS — Z170 Estrogen receptor positive status [ER+]: Secondary | ICD-10-CM | POA: Diagnosis not present

## 2020-06-01 DIAGNOSIS — D0512 Intraductal carcinoma in situ of left breast: Secondary | ICD-10-CM | POA: Diagnosis not present

## 2020-06-03 DIAGNOSIS — D0592 Unspecified type of carcinoma in situ of left breast: Secondary | ICD-10-CM | POA: Diagnosis not present

## 2020-06-03 DIAGNOSIS — Z510 Encounter for antineoplastic radiation therapy: Secondary | ICD-10-CM | POA: Diagnosis not present

## 2020-06-03 DIAGNOSIS — D0512 Intraductal carcinoma in situ of left breast: Secondary | ICD-10-CM | POA: Diagnosis not present

## 2020-06-12 DIAGNOSIS — D0592 Unspecified type of carcinoma in situ of left breast: Secondary | ICD-10-CM | POA: Diagnosis not present

## 2020-06-12 DIAGNOSIS — Z510 Encounter for antineoplastic radiation therapy: Secondary | ICD-10-CM | POA: Diagnosis not present

## 2020-06-12 DIAGNOSIS — D0512 Intraductal carcinoma in situ of left breast: Secondary | ICD-10-CM | POA: Diagnosis not present

## 2020-06-15 DIAGNOSIS — D0512 Intraductal carcinoma in situ of left breast: Secondary | ICD-10-CM | POA: Diagnosis not present

## 2020-06-15 DIAGNOSIS — Z510 Encounter for antineoplastic radiation therapy: Secondary | ICD-10-CM | POA: Diagnosis not present

## 2020-06-15 DIAGNOSIS — D0592 Unspecified type of carcinoma in situ of left breast: Secondary | ICD-10-CM | POA: Diagnosis not present

## 2020-06-16 DIAGNOSIS — D0512 Intraductal carcinoma in situ of left breast: Secondary | ICD-10-CM | POA: Diagnosis not present

## 2020-06-16 DIAGNOSIS — Z510 Encounter for antineoplastic radiation therapy: Secondary | ICD-10-CM | POA: Diagnosis not present

## 2020-06-16 DIAGNOSIS — D0592 Unspecified type of carcinoma in situ of left breast: Secondary | ICD-10-CM | POA: Diagnosis not present

## 2020-06-17 DIAGNOSIS — Z510 Encounter for antineoplastic radiation therapy: Secondary | ICD-10-CM | POA: Diagnosis not present

## 2020-06-17 DIAGNOSIS — D0592 Unspecified type of carcinoma in situ of left breast: Secondary | ICD-10-CM | POA: Diagnosis not present

## 2020-06-17 DIAGNOSIS — D0512 Intraductal carcinoma in situ of left breast: Secondary | ICD-10-CM | POA: Diagnosis not present

## 2020-06-18 DIAGNOSIS — D0592 Unspecified type of carcinoma in situ of left breast: Secondary | ICD-10-CM | POA: Diagnosis not present

## 2020-06-18 DIAGNOSIS — Z510 Encounter for antineoplastic radiation therapy: Secondary | ICD-10-CM | POA: Diagnosis not present

## 2020-06-21 DIAGNOSIS — Z510 Encounter for antineoplastic radiation therapy: Secondary | ICD-10-CM | POA: Diagnosis not present

## 2020-06-21 DIAGNOSIS — D0592 Unspecified type of carcinoma in situ of left breast: Secondary | ICD-10-CM | POA: Diagnosis not present

## 2020-06-22 DIAGNOSIS — Z510 Encounter for antineoplastic radiation therapy: Secondary | ICD-10-CM | POA: Diagnosis not present

## 2020-06-22 DIAGNOSIS — R21 Rash and other nonspecific skin eruption: Secondary | ICD-10-CM | POA: Diagnosis not present

## 2020-06-22 DIAGNOSIS — D0512 Intraductal carcinoma in situ of left breast: Secondary | ICD-10-CM | POA: Diagnosis not present

## 2020-06-23 DIAGNOSIS — R21 Rash and other nonspecific skin eruption: Secondary | ICD-10-CM | POA: Diagnosis not present

## 2020-06-23 DIAGNOSIS — Z510 Encounter for antineoplastic radiation therapy: Secondary | ICD-10-CM | POA: Diagnosis not present

## 2020-06-23 DIAGNOSIS — D0512 Intraductal carcinoma in situ of left breast: Secondary | ICD-10-CM | POA: Diagnosis not present

## 2020-06-24 DIAGNOSIS — R21 Rash and other nonspecific skin eruption: Secondary | ICD-10-CM | POA: Diagnosis not present

## 2020-06-24 DIAGNOSIS — Z510 Encounter for antineoplastic radiation therapy: Secondary | ICD-10-CM | POA: Diagnosis not present

## 2020-06-24 DIAGNOSIS — D0512 Intraductal carcinoma in situ of left breast: Secondary | ICD-10-CM | POA: Diagnosis not present

## 2020-06-25 DIAGNOSIS — R21 Rash and other nonspecific skin eruption: Secondary | ICD-10-CM | POA: Diagnosis not present

## 2020-06-25 DIAGNOSIS — Z510 Encounter for antineoplastic radiation therapy: Secondary | ICD-10-CM | POA: Diagnosis not present

## 2020-06-25 DIAGNOSIS — D0512 Intraductal carcinoma in situ of left breast: Secondary | ICD-10-CM | POA: Diagnosis not present

## 2020-06-28 DIAGNOSIS — D0512 Intraductal carcinoma in situ of left breast: Secondary | ICD-10-CM | POA: Diagnosis not present

## 2020-06-28 DIAGNOSIS — R21 Rash and other nonspecific skin eruption: Secondary | ICD-10-CM | POA: Diagnosis not present

## 2020-06-28 DIAGNOSIS — Z510 Encounter for antineoplastic radiation therapy: Secondary | ICD-10-CM | POA: Diagnosis not present

## 2020-06-29 DIAGNOSIS — D0512 Intraductal carcinoma in situ of left breast: Secondary | ICD-10-CM | POA: Diagnosis not present

## 2020-06-29 DIAGNOSIS — R21 Rash and other nonspecific skin eruption: Secondary | ICD-10-CM | POA: Diagnosis not present

## 2020-06-29 DIAGNOSIS — Z510 Encounter for antineoplastic radiation therapy: Secondary | ICD-10-CM | POA: Diagnosis not present

## 2020-06-30 DIAGNOSIS — D0512 Intraductal carcinoma in situ of left breast: Secondary | ICD-10-CM | POA: Diagnosis not present

## 2020-06-30 DIAGNOSIS — R21 Rash and other nonspecific skin eruption: Secondary | ICD-10-CM | POA: Diagnosis not present

## 2020-06-30 DIAGNOSIS — Z510 Encounter for antineoplastic radiation therapy: Secondary | ICD-10-CM | POA: Diagnosis not present

## 2020-07-01 DIAGNOSIS — R21 Rash and other nonspecific skin eruption: Secondary | ICD-10-CM | POA: Diagnosis not present

## 2020-07-01 DIAGNOSIS — D0512 Intraductal carcinoma in situ of left breast: Secondary | ICD-10-CM | POA: Diagnosis not present

## 2020-07-01 DIAGNOSIS — Z510 Encounter for antineoplastic radiation therapy: Secondary | ICD-10-CM | POA: Diagnosis not present

## 2020-07-02 DIAGNOSIS — Z510 Encounter for antineoplastic radiation therapy: Secondary | ICD-10-CM | POA: Diagnosis not present

## 2020-07-02 DIAGNOSIS — D0512 Intraductal carcinoma in situ of left breast: Secondary | ICD-10-CM | POA: Diagnosis not present

## 2020-07-02 DIAGNOSIS — R21 Rash and other nonspecific skin eruption: Secondary | ICD-10-CM | POA: Diagnosis not present

## 2020-07-05 DIAGNOSIS — Z510 Encounter for antineoplastic radiation therapy: Secondary | ICD-10-CM | POA: Diagnosis not present

## 2020-07-05 DIAGNOSIS — D0512 Intraductal carcinoma in situ of left breast: Secondary | ICD-10-CM | POA: Diagnosis not present

## 2020-07-05 DIAGNOSIS — R21 Rash and other nonspecific skin eruption: Secondary | ICD-10-CM | POA: Diagnosis not present

## 2020-07-06 DIAGNOSIS — Z510 Encounter for antineoplastic radiation therapy: Secondary | ICD-10-CM | POA: Diagnosis not present

## 2020-07-06 DIAGNOSIS — D0512 Intraductal carcinoma in situ of left breast: Secondary | ICD-10-CM | POA: Diagnosis not present

## 2020-07-06 DIAGNOSIS — R21 Rash and other nonspecific skin eruption: Secondary | ICD-10-CM | POA: Diagnosis not present

## 2020-07-07 DIAGNOSIS — R21 Rash and other nonspecific skin eruption: Secondary | ICD-10-CM | POA: Diagnosis not present

## 2020-07-07 DIAGNOSIS — Z510 Encounter for antineoplastic radiation therapy: Secondary | ICD-10-CM | POA: Diagnosis not present

## 2020-07-07 DIAGNOSIS — D0512 Intraductal carcinoma in situ of left breast: Secondary | ICD-10-CM | POA: Diagnosis not present

## 2020-07-08 DIAGNOSIS — D0512 Intraductal carcinoma in situ of left breast: Secondary | ICD-10-CM | POA: Diagnosis not present

## 2020-07-08 DIAGNOSIS — R21 Rash and other nonspecific skin eruption: Secondary | ICD-10-CM | POA: Diagnosis not present

## 2020-07-08 DIAGNOSIS — Z510 Encounter for antineoplastic radiation therapy: Secondary | ICD-10-CM | POA: Diagnosis not present

## 2020-07-09 DIAGNOSIS — R21 Rash and other nonspecific skin eruption: Secondary | ICD-10-CM | POA: Diagnosis not present

## 2020-07-09 DIAGNOSIS — D0512 Intraductal carcinoma in situ of left breast: Secondary | ICD-10-CM | POA: Diagnosis not present

## 2020-07-09 DIAGNOSIS — Z510 Encounter for antineoplastic radiation therapy: Secondary | ICD-10-CM | POA: Diagnosis not present

## 2020-07-12 DIAGNOSIS — Z510 Encounter for antineoplastic radiation therapy: Secondary | ICD-10-CM | POA: Diagnosis not present

## 2020-07-12 DIAGNOSIS — D0512 Intraductal carcinoma in situ of left breast: Secondary | ICD-10-CM | POA: Diagnosis not present

## 2020-07-12 DIAGNOSIS — R21 Rash and other nonspecific skin eruption: Secondary | ICD-10-CM | POA: Diagnosis not present

## 2020-07-13 DIAGNOSIS — R21 Rash and other nonspecific skin eruption: Secondary | ICD-10-CM | POA: Diagnosis not present

## 2020-07-13 DIAGNOSIS — Z510 Encounter for antineoplastic radiation therapy: Secondary | ICD-10-CM | POA: Diagnosis not present

## 2020-07-13 DIAGNOSIS — D0512 Intraductal carcinoma in situ of left breast: Secondary | ICD-10-CM | POA: Diagnosis not present

## 2020-07-14 DIAGNOSIS — D0512 Intraductal carcinoma in situ of left breast: Secondary | ICD-10-CM | POA: Diagnosis not present

## 2020-07-14 DIAGNOSIS — Z510 Encounter for antineoplastic radiation therapy: Secondary | ICD-10-CM | POA: Diagnosis not present

## 2020-07-14 DIAGNOSIS — R21 Rash and other nonspecific skin eruption: Secondary | ICD-10-CM | POA: Diagnosis not present

## 2020-07-30 DIAGNOSIS — H2513 Age-related nuclear cataract, bilateral: Secondary | ICD-10-CM | POA: Diagnosis not present

## 2020-07-30 DIAGNOSIS — H524 Presbyopia: Secondary | ICD-10-CM | POA: Diagnosis not present

## 2020-07-30 DIAGNOSIS — H353132 Nonexudative age-related macular degeneration, bilateral, intermediate dry stage: Secondary | ICD-10-CM | POA: Diagnosis not present

## 2020-08-12 DIAGNOSIS — H2513 Age-related nuclear cataract, bilateral: Secondary | ICD-10-CM | POA: Diagnosis not present

## 2020-08-12 DIAGNOSIS — H43813 Vitreous degeneration, bilateral: Secondary | ICD-10-CM | POA: Diagnosis not present

## 2020-08-12 DIAGNOSIS — H353132 Nonexudative age-related macular degeneration, bilateral, intermediate dry stage: Secondary | ICD-10-CM | POA: Diagnosis not present

## 2020-08-12 DIAGNOSIS — H43391 Other vitreous opacities, right eye: Secondary | ICD-10-CM | POA: Diagnosis not present

## 2020-08-23 DIAGNOSIS — D0512 Intraductal carcinoma in situ of left breast: Secondary | ICD-10-CM | POA: Diagnosis not present

## 2020-08-23 DIAGNOSIS — D469 Myelodysplastic syndrome, unspecified: Secondary | ICD-10-CM | POA: Diagnosis not present

## 2020-11-02 DIAGNOSIS — H524 Presbyopia: Secondary | ICD-10-CM | POA: Diagnosis not present

## 2020-11-02 DIAGNOSIS — H2513 Age-related nuclear cataract, bilateral: Secondary | ICD-10-CM | POA: Diagnosis not present

## 2020-11-04 DIAGNOSIS — H5203 Hypermetropia, bilateral: Secondary | ICD-10-CM | POA: Diagnosis not present

## 2020-11-04 DIAGNOSIS — H52223 Regular astigmatism, bilateral: Secondary | ICD-10-CM | POA: Diagnosis not present

## 2020-11-10 DIAGNOSIS — H43813 Vitreous degeneration, bilateral: Secondary | ICD-10-CM | POA: Diagnosis not present

## 2020-11-10 DIAGNOSIS — H2513 Age-related nuclear cataract, bilateral: Secondary | ICD-10-CM | POA: Diagnosis not present

## 2020-11-10 DIAGNOSIS — H43391 Other vitreous opacities, right eye: Secondary | ICD-10-CM | POA: Diagnosis not present

## 2020-11-10 DIAGNOSIS — H353132 Nonexudative age-related macular degeneration, bilateral, intermediate dry stage: Secondary | ICD-10-CM | POA: Diagnosis not present

## 2020-12-09 DIAGNOSIS — D649 Anemia, unspecified: Secondary | ICD-10-CM | POA: Diagnosis not present

## 2020-12-09 DIAGNOSIS — D4620 Refractory anemia with excess of blasts, unspecified: Secondary | ICD-10-CM | POA: Diagnosis not present

## 2020-12-09 DIAGNOSIS — Z170 Estrogen receptor positive status [ER+]: Secondary | ICD-10-CM | POA: Diagnosis not present

## 2020-12-09 DIAGNOSIS — D0512 Intraductal carcinoma in situ of left breast: Secondary | ICD-10-CM | POA: Diagnosis not present

## 2020-12-09 DIAGNOSIS — Z79811 Long term (current) use of aromatase inhibitors: Secondary | ICD-10-CM | POA: Diagnosis not present

## 2020-12-09 DIAGNOSIS — M25512 Pain in left shoulder: Secondary | ICD-10-CM | POA: Diagnosis not present

## 2020-12-09 DIAGNOSIS — D72819 Decreased white blood cell count, unspecified: Secondary | ICD-10-CM | POA: Diagnosis not present

## 2020-12-22 ENCOUNTER — Other Ambulatory Visit: Payer: Self-pay | Admitting: Family Medicine

## 2020-12-28 ENCOUNTER — Telehealth: Payer: Self-pay | Admitting: Family Medicine

## 2020-12-28 DIAGNOSIS — M25512 Pain in left shoulder: Secondary | ICD-10-CM | POA: Diagnosis not present

## 2020-12-28 DIAGNOSIS — M7582 Other shoulder lesions, left shoulder: Secondary | ICD-10-CM | POA: Diagnosis not present

## 2020-12-28 MED ORDER — LOSARTAN POTASSIUM 50 MG PO TABS: 50.0000 mg | ORAL_TABLET | Freq: Every day | ORAL | 3 refills | Status: DC

## 2020-12-28 NOTE — Telephone Encounter (Signed)
  Prescription Request  12/28/2020  Is this a "Controlled Substance" medicine? no Have you seen your PCP in the last 2 weeks? No pt has appt with Dr Darnell Level. 01/24/21 with check up If YES, route message to pool  -  If NO, patient needs to be scheduled for appointment.  What is the name of the medication or equipment?losartan (COZAAR) 50 MG tablet  Have you contacted your pharmacy to request a refill? Yes  Which pharmacy would you like this sent to? [facility]   Patient notified that their request is being sent to the clinical staff for review and that they should receive a response within 2 business days.

## 2021-01-10 DIAGNOSIS — Z853 Personal history of malignant neoplasm of breast: Secondary | ICD-10-CM | POA: Diagnosis not present

## 2021-01-10 DIAGNOSIS — R922 Inconclusive mammogram: Secondary | ICD-10-CM | POA: Diagnosis not present

## 2021-01-10 DIAGNOSIS — D0512 Intraductal carcinoma in situ of left breast: Secondary | ICD-10-CM | POA: Diagnosis not present

## 2021-01-12 DIAGNOSIS — D0512 Intraductal carcinoma in situ of left breast: Secondary | ICD-10-CM | POA: Diagnosis not present

## 2021-01-24 ENCOUNTER — Ambulatory Visit: Payer: BC Managed Care – PPO | Admitting: Family Medicine

## 2021-01-24 ENCOUNTER — Other Ambulatory Visit: Payer: Self-pay

## 2021-01-24 ENCOUNTER — Encounter: Payer: Self-pay | Admitting: Family Medicine

## 2021-01-24 VITALS — BP 114/71 | Temp 97.6°F | Ht 65.0 in | Wt 130.8 lb

## 2021-01-24 DIAGNOSIS — I10 Essential (primary) hypertension: Secondary | ICD-10-CM | POA: Diagnosis not present

## 2021-01-24 DIAGNOSIS — E7800 Pure hypercholesterolemia, unspecified: Secondary | ICD-10-CM

## 2021-01-24 DIAGNOSIS — Z23 Encounter for immunization: Secondary | ICD-10-CM | POA: Diagnosis not present

## 2021-01-24 DIAGNOSIS — D4620 Refractory anemia with excess of blasts, unspecified: Secondary | ICD-10-CM

## 2021-01-24 DIAGNOSIS — D0512 Intraductal carcinoma in situ of left breast: Secondary | ICD-10-CM

## 2021-01-24 DIAGNOSIS — D46Z Other myelodysplastic syndromes: Secondary | ICD-10-CM

## 2021-01-24 DIAGNOSIS — Z1211 Encounter for screening for malignant neoplasm of colon: Secondary | ICD-10-CM

## 2021-01-24 MED ORDER — PRAVASTATIN SODIUM 40 MG PO TABS: ORAL_TABLET | ORAL | 3 refills | Status: DC

## 2021-01-24 NOTE — Patient Instructions (Signed)
Come in for fasting labs at your convenience.

## 2021-01-24 NOTE — Progress Notes (Signed)
Subjective: CC: Hypertension, hyperlipidemia  PCP: Janora Norlander, DO NOB:SJGGE F Demont is a 75 y.o. female presenting to clinic today for:  1.  Hypertension associate with hyperlipidemia Patient compliant with Cozaar 50 mg daily, Pravachol 60 mg daily.  No reports of chest pain, shortness of breath.  2.  Breast cancer Patient diagnosed with breast cancer earlier this year.  She is closely followed by her oncologist.  Currently treated with Arimidex 1 mg.  Denies any issues with hot flashes or other adverse side effects.  She has had radiation as well and again denies any adverse reactions to that.  She was essentially told that she has a 0% chance of recurrence at this point.  She was also told that her myelodysplastic syndrome is really quiet and no reason to have any specific treatment at this time.   ROS: Per HPI  Allergies  Allergen Reactions   Other Other (See Comments)    Msg= cramps and diarrhea   Past Medical History:  Diagnosis Date   Complication of anesthesia    slow to wake up   Diverticulosis    History of colon polyps    HTN (hypertension), benign    Low grade myelodysplastic syndrome lesions (HCC) 11/20/2018   Migraine with aura    Other and unspecified hyperlipidemia    Tachycardia    Vitamin B 12 deficiency     Current Outpatient Medications:    Cholecalciferol (VITAMIN D3) 5000 UNITS TABS, Take 5,000 Units by mouth daily. , Disp: , Rfl:    losartan (COZAAR) 50 MG tablet, Take 1 tablet (50 mg total) by mouth daily., Disp: 90 tablet, Rfl: 3   Melatonin 10 MG TABS, Take by mouth., Disp: , Rfl:    Multiple Vitamins-Minerals (PRESERVISION AREDS 2) CAPS, Take by mouth., Disp: , Rfl:    pravastatin (PRAVACHOL) 40 MG tablet, Take 1.5 tablets qhs, Disp: 135 tablet, Rfl: 3   pyridOXINE (VITAMIN B-6) 100 MG tablet, Take 100 mg by mouth daily., Disp: , Rfl:    UNABLE TO FIND, Med Name: Nerve shield plus, Disp: , Rfl:  Social History   Socioeconomic  History   Marital status: Married    Spouse name: Not on file   Number of children: Not on file   Years of education: Not on file   Highest education level: Not on file  Occupational History   Not on file  Tobacco Use   Smoking status: Former    Packs/day: 1.00    Years: 10.00    Pack years: 10.00    Types: Cigarettes    Quit date: 04/24/1982    Years since quitting: 38.7   Smokeless tobacco: Never  Vaping Use   Vaping Use: Never used  Substance and Sexual Activity   Alcohol use: Yes    Comment: 2 glass of wine per day   Drug use: No   Sexual activity: Yes    Partners: Male    Birth control/protection: Post-menopausal  Other Topics Concern   Not on file  Social History Narrative   Not on file   Social Determinants of Health   Financial Resource Strain: Not on file  Food Insecurity: Not on file  Transportation Needs: Not on file  Physical Activity: Not on file  Stress: Not on file  Social Connections: Not on file  Intimate Partner Violence: Not on file   Family History  Problem Relation Age of Onset   Alzheimer's disease Mother    Allergic rhinitis Mother  Cancer Father        prostate   Allergic rhinitis Father    Allergic rhinitis Sister     Objective: Office vital signs reviewed. BP 114/71   Temp 97.6 F (36.4 C)   Ht '5\' 5"'  (1.651 m)   Wt 130 lb 12.8 oz (59.3 kg)   BMI 21.77 kg/m   Physical Examination:  General: Awake, alert, well nourished, No acute distress HEENT: Normal; clear white.  Moist mucous membranes Cardio: regular rate and rhythm, S1S2 heard, no murmurs appreciated Pulm: clear to auscultation bilaterally, no wheezes, rhonchi or rales; normal work of breathing on room air Extremities: warm, well perfused, No edema, cyanosis or clubbing; +2 pulses bilaterally MSK: Normal gait and station  Assessment/ Plan: 75 y.o. female   HTN (hypertension), benign - Plan: CMP14+EGFR  Pure hypercholesterolemia - Plan: CMP14+EGFR, Lipid panel,  pravastatin (PRAVACHOL) 40 MG tablet  Low grade myelodysplastic syndrome ([place]) - Plan: CBC, VITAMIN D 25 Hydroxy (Vit-D Deficiency, Fractures)  Ductal carcinoma in situ (DCIS) of left breast - Plan: CBC, VITAMIN D 25 Hydroxy (Vit-D Deficiency, Fractures)  Need for pneumococcal vaccination - Plan: Pneumococcal conjugate vaccine 20-valent (Prevnar 20)  Colon cancer screening - Plan: Cologuard  Blood pressures well controlled.  Check CMP  Check fasting lipid panel.  She will return for this.  Continue pravastatin  Her myelodysplastic syndrome and breast cancers are closely followed by hematology/oncology.  Pneumococcal vaccination administered  Wish to proceed with Cologuard and lieu of colonoscopy.  She is having no symptoms and has no apparent familial risk for colon cancer.  No orders of the defined types were placed in this encounter.  No orders of the defined types were placed in this encounter.    Janora Norlander, DO [place] [phone]

## 2021-01-26 ENCOUNTER — Encounter: Payer: Self-pay | Admitting: Family Medicine

## 2021-01-26 ENCOUNTER — Ambulatory Visit: Payer: BC Managed Care – PPO | Admitting: Family Medicine

## 2021-01-26 ENCOUNTER — Other Ambulatory Visit: Payer: Self-pay

## 2021-01-26 VITALS — BP 129/71 | HR 96 | Temp 97.4°F | Ht 65.0 in | Wt 130.8 lb

## 2021-01-26 DIAGNOSIS — R0781 Pleurodynia: Secondary | ICD-10-CM

## 2021-01-26 DIAGNOSIS — M62830 Muscle spasm of back: Secondary | ICD-10-CM | POA: Diagnosis not present

## 2021-01-26 MED ORDER — KETOROLAC TROMETHAMINE 30 MG/ML IJ SOLN
30.0000 mg | Freq: Once | INTRAMUSCULAR | Status: AC
  Administered 2021-01-26: 30 mg via INTRAMUSCULAR

## 2021-01-26 MED ORDER — METHOCARBAMOL 500 MG PO TABS: 250.0000 mg | ORAL_TABLET | Freq: Four times a day (QID) | ORAL | 0 refills | Status: DC | PRN

## 2021-01-26 NOTE — Patient Instructions (Signed)
You have prescribed a nonsteroidal anti-inflammatory drug (NSAID) today. This will help with your pain and inflammation. Please do not take any other NSAIDs (ibuprofen/Motrin/Advil, naproxen/Aleve, meloxicam/Mobic, Voltaren/diclofenac). Please make sure to eat a meal when taking this medication.   Caution: If you have a history of acid reflux/indigestion, I recommend that you take an antacid (such as Prilosec, Prevacid) daily while on the NSAID. If you have a history of bleeding disorder, gastric ulcer, are on a blood thinner (like warfarin/Coumadin, Xarelto, Eliquis, etc) please do not take NSAID. If you have ever had a heart attack, you should not take NSAIDs.  Chest Wall Pain Chest wall pain is pain in or around the bones and muscles of your chest. Chest wall pain may be caused by: An injury. Coughing a lot. Using your chest and arm muscles too much. Sometimes, the cause may not be known. This pain may take a few weeks or longer to get better. Follow these instructions at home: Managing pain, stiffness, and swelling If told, put ice on the painful area: Put ice in a plastic bag. Place a towel between your skin and the bag. Leave the ice on for 20 minutes, 2-3 times a day.  Activity Rest as told by your doctor. Avoid doing things that cause pain. This includes lifting heavy items. Ask your doctor what activities are safe for you. General instructions  Take over-the-counter and prescription medicines only as told by your doctor. Do not use any products that contain nicotine or tobacco, such as cigarettes, e-cigarettes, and chewing tobacco. If you need help quitting, ask your doctor. Keep all follow-up visits as told by your doctor. This is important. Contact a doctor if: You have a fever. Your chest pain gets worse. You have new symptoms. Get help right away if: You feel sick to your stomach (nauseous) or you throw up (vomit). You feel sweaty or light-headed. You have a cough  with mucus from your lungs (sputum) or you cough up blood. You are short of breath. These symptoms may be an emergency. Do not wait to see if the symptoms will go away. Get medical help right away. Call your local emergency services (911 in the [place] Do not drive yourself to the hospital. Summary Chest wall pain is pain in or around the bones and muscles of your chest. It may be treated with ice, rest, and medicines. Your condition may also get better if you avoid doing things that cause pain. Contact a doctor if you have a fever, chest pain that gets worse, or new symptoms. Get help right away if you feel light-headed or you get short of breath. These symptoms may be an emergency. This information is not intended to replace advice given to you by your health care provider. Make sure you discuss any questions you have with your health care provider. Document Revised: 07/13/2020 Document Reviewed: 06/25/2020 Elsevier Patient Education  [place].

## 2021-01-26 NOTE — Progress Notes (Signed)
Subjective: CC:rib pain PCP: Emily Norlander, DO XTK:WIOXB Emily Doyle is a 75 y.o. female presenting to clinic today for:  1. Rib pain She reports abrupt onset 2 days ago.  She felt like she "got kicked in the back".  She denies any pleuritic pain, shortness of breath, chest pain, diaphoresis, nausea, vomiting.  The pain is primarily along the right side under her shoulder blade and radiating along the axillary side.  It became extremely uncomfortable at 1 point.  She has used her meloxicam as prescribed at bedtime for her shoulder but also added some ibuprofen recently with various results.  Pain today is 8 out of 10.  She has been using heat which does relieve some.  Massage also seems to help some.   ROS: Per HPI  Allergies  Allergen Reactions   Other Other (See Comments)    Msg= cramps and diarrhea   Past Medical History:  Diagnosis Date   Complication of anesthesia    slow to wake up   Diverticulosis    History of colon polyps    HTN (hypertension), benign    Low grade myelodysplastic syndrome lesions (HCC) 11/20/2018   Migraine with aura    Other and unspecified hyperlipidemia    Tachycardia    Vitamin B 12 deficiency     Current Outpatient Medications:    anastrozole (ARIMIDEX) 1 MG tablet, Take 1 mg by mouth daily., Disp: , Rfl:    Cholecalciferol (VITAMIN D3) 5000 UNITS TABS, Take 5,000 Units by mouth daily. , Disp: , Rfl:    losartan (COZAAR) 50 MG tablet, Take 1 tablet (50 mg total) by mouth daily., Disp: 90 tablet, Rfl: 3   Melatonin 10 MG TABS, Take by mouth., Disp: , Rfl:    meloxicam (MOBIC) 15 MG tablet, Take 15 mg by mouth daily., Disp: , Rfl:    Multiple Vitamins-Minerals (PRESERVISION AREDS 2) CAPS, Take by mouth., Disp: , Rfl:    pravastatin (PRAVACHOL) 40 MG tablet, Take 1.5 tablets qhs, Disp: 135 tablet, Rfl: 3   pyridOXINE (VITAMIN B-6) 100 MG tablet, Take 100 mg by mouth daily., Disp: , Rfl:    UNABLE TO FIND, Med Name: Nerve shield plus, Disp: ,  Rfl:  Social History   Socioeconomic History   Marital status: Married    Spouse name: Not on file   Number of children: Not on file   Years of education: Not on file   Highest education level: Not on file  Occupational History   Not on file  Tobacco Use   Smoking status: Former    Packs/day: 1.00    Years: 10.00    Pack years: 10.00    Types: Cigarettes    Quit date: 04/24/1982    Years since quitting: 38.7   Smokeless tobacco: Never  Vaping Use   Vaping Use: Never used  Substance and Sexual Activity   Alcohol use: Yes    Comment: 2 glass of wine per day   Drug use: No   Sexual activity: Yes    Partners: Male    Birth control/protection: Post-menopausal  Other Topics Concern   Not on file  Social History Narrative   Not on file   Social Determinants of Health   Financial Resource Strain: Not on file  Food Insecurity: Not on file  Transportation Needs: Not on file  Physical Activity: Not on file  Stress: Not on file  Social Connections: Not on file  Intimate Partner Violence: Not on file  Family History  Problem Relation Age of Onset   Alzheimer's disease Mother    Allergic rhinitis Mother    Cancer Father        prostate   Allergic rhinitis Father    Allergic rhinitis Sister     Objective: Office vital signs reviewed. BP 129/71   Pulse 96   Temp (!) 97.4 Emily (36.3 C)   Ht 5\' 5"  (1.651 m)   Wt 130 lb 12.8 oz (59.3 kg)   SpO2 99%   BMI 21.77 kg/m   Physical Examination:  General: Awake, alert, well nourished, No acute distress Cardio: regular rate and rhythm  Pulm: normal work of breathing on room air MSK:  Back/ Chest: There is palpable increased tonicity of the paraspinal muscles noted along the T7-T9 vertebra.  She has associated tenderness along the intercostal muscles in this region radiating to the right side.  There are no palpable bony defects.  Assessment/ Plan: 75 y.o. female   Rib pain on right side - Plan: methocarbamol (ROBAXIN)  500 MG tablet, ketorolac (TORADOL) 30 MG/ML injection 30 mg  Paraspinal muscle spasm - Plan: methocarbamol (ROBAXIN) 962 MG tablet  Uncertain etiology but certainly sounds musculoskeletal in nature.  She was given a Toradol injection as she does not really like steroids.  I advised her not to take meloxicam tonight and to avoid taking both ibuprofen and meloxicam in the same day.  I have also placed her on a muscle relaxer.  Caution sedation.  Differential diagnoses considered include PE simply because of breast cancer diagnosis but she has well score of 0.  However, if symptoms worsen or she develops any other worrisome symptoms or signs, low threshold to evaluate further for this  No orders of the defined types were placed in this encounter.  No orders of the defined types were placed in this encounter.    Emily Norlander, DO [place] [phone]

## 2021-01-27 ENCOUNTER — Telehealth: Payer: Self-pay | Admitting: Family Medicine

## 2021-01-27 MED ORDER — DICLOFENAC SODIUM 1 % EX GEL: 2.0000 g | Freq: Four times a day (QID) | CUTANEOUS | 1 refills | Status: AC

## 2021-01-27 MED ORDER — PREDNISONE 10 MG (21) PO TBPK: ORAL_TABLET | ORAL | 0 refills | Status: DC

## 2021-01-27 NOTE — Telephone Encounter (Signed)
Read message to pt but she still wants to speak with nurse

## 2021-01-27 NOTE — Telephone Encounter (Signed)
Pt wants to know if there is anything else that can be done for her rib pain. She was seen by Dr. Darnell Level on 10/5 and given a muscle relaxer but pt stated that she did not think that it was helping. Please call back and advise.

## 2021-01-27 NOTE — Telephone Encounter (Signed)
Continue Motrin and  Robaxin. I have sent a prednisone  dose pack and diclofenac gel to her pharmacy.   If she develops any rash or blisters call and let us know.

## 2021-01-27 NOTE — Telephone Encounter (Signed)
Patient aware and verbalized understanding. °

## 2021-01-27 NOTE — Telephone Encounter (Signed)
Lmtcb.

## 2021-01-31 ENCOUNTER — Telehealth: Payer: Self-pay | Admitting: Family Medicine

## 2021-01-31 NOTE — Telephone Encounter (Signed)
Appt scheduled 10/13 with Dr. Lajuana Ripple at [facility]

## 2021-02-01 DIAGNOSIS — Z1211 Encounter for screening for malignant neoplasm of colon: Secondary | ICD-10-CM | POA: Diagnosis not present

## 2021-02-03 ENCOUNTER — Ambulatory Visit: Payer: BC Managed Care – PPO | Admitting: Family Medicine

## 2021-02-03 ENCOUNTER — Other Ambulatory Visit: Payer: Self-pay

## 2021-02-03 ENCOUNTER — Ambulatory Visit (INDEPENDENT_AMBULATORY_CARE_PROVIDER_SITE_OTHER): Payer: BC Managed Care – PPO

## 2021-02-03 ENCOUNTER — Encounter: Payer: Self-pay | Admitting: Family Medicine

## 2021-02-03 VITALS — BP 129/82 | HR 95 | Temp 97.9°F | Ht 65.0 in | Wt 130.8 lb

## 2021-02-03 DIAGNOSIS — R0781 Pleurodynia: Secondary | ICD-10-CM | POA: Diagnosis not present

## 2021-02-03 DIAGNOSIS — R109 Unspecified abdominal pain: Secondary | ICD-10-CM | POA: Diagnosis not present

## 2021-02-03 NOTE — Progress Notes (Signed)
Subjective: CC: Right rib pain PCP: Janora Norlander, DO JGO:TLXBW Emily Doyle is a 75 y.o. female presenting to clinic today for:  1.  CVA pain Patient was seen last week for right rib pain.  This was thought to be musculoskeletal in nature.  She notes that on Friday symptoms became very severe and it was radiating to the front of her rib.  She contacted the office and was given prednisone and a cream.  She notes that she never started the prednisone because it causes quite a bit of side effects for her.  She has been utilizing ibuprofen, muscle relaxer and symptoms actually seem a bit better today.  She notes almost total resolution of the posterior right-sided rib pain.  Anteriorly its not painful either but she feels that something still is slightly off in that region.  She denies any hematuria, dysuria, change in urine output, nausea or fevers.   ROS: Per HPI  Allergies  Allergen Reactions   Other Other (See Comments)    Msg= cramps and diarrhea   Past Medical History:  Diagnosis Date   Complication of anesthesia    slow to wake up   Diverticulosis    History of colon polyps    HTN (hypertension), benign    Low grade myelodysplastic syndrome lesions (HCC) 11/20/2018   Migraine with aura    Other and unspecified hyperlipidemia    Tachycardia    Vitamin B 12 deficiency     Current Outpatient Medications:    anastrozole (ARIMIDEX) 1 MG tablet, Take 1 mg by mouth daily., Disp: , Rfl:    Cholecalciferol (VITAMIN D3) 5000 UNITS TABS, Take 5,000 Units by mouth daily. , Disp: , Rfl:    diclofenac Sodium (VOLTAREN) 1 % GEL, Apply 2 g topically 4 (four) times daily., Disp: 150 g, Rfl: 1   losartan (COZAAR) 50 MG tablet, Take 1 tablet (50 mg total) by mouth daily., Disp: 90 tablet, Rfl: 3   Melatonin 10 MG TABS, Take by mouth., Disp: , Rfl:    meloxicam (MOBIC) 15 MG tablet, Take 15 mg by mouth daily., Disp: , Rfl:    methocarbamol (ROBAXIN) 500 MG tablet, Take 0.5-1 tablets  (250-500 mg total) by mouth every 6 (six) hours as needed for muscle spasms., Disp: 40 tablet, Rfl: 0   Multiple Vitamins-Minerals (PRESERVISION AREDS 2) CAPS, Take by mouth., Disp: , Rfl:    pravastatin (PRAVACHOL) 40 MG tablet, Take 1.5 tablets qhs, Disp: 135 tablet, Rfl: 3   predniSONE (STERAPRED UNI-PAK 21 TAB) 10 MG (21) TBPK tablet, Use as directed, Disp: 21 tablet, Rfl: 0   pyridOXINE (VITAMIN B-6) 100 MG tablet, Take 100 mg by mouth daily., Disp: , Rfl:    UNABLE TO FIND, Med Name: Nerve shield plus, Disp: , Rfl:  Social History   Socioeconomic History   Marital status: Married    Spouse name: Not on file   Number of children: Not on file   Years of education: Not on file   Highest education level: Not on file  Occupational History   Not on file  Tobacco Use   Smoking status: Former    Packs/day: 1.00    Years: 10.00    Pack years: 10.00    Types: Cigarettes    Quit date: 04/24/1982    Years since quitting: 38.8   Smokeless tobacco: Never  Vaping Use   Vaping Use: Never used  Substance and Sexual Activity   Alcohol use: Yes    Comment:  2 glass of wine per day   Drug use: No   Sexual activity: Yes    Partners: Male    Birth control/protection: Post-menopausal  Other Topics Concern   Not on file  Social History Narrative   Not on file   Social Determinants of Health   Financial Resource Strain: Not on file  Food Insecurity: Not on file  Transportation Needs: Not on file  Physical Activity: Not on file  Stress: Not on file  Social Connections: Not on file  Intimate Partner Violence: Not on file   Family History  Problem Relation Age of Onset   Alzheimer's disease Mother    Allergic rhinitis Mother    Cancer Father        prostate   Allergic rhinitis Father    Allergic rhinitis Sister     Objective: Office vital signs reviewed. BP 129/82   Pulse 95   Temp 97.9 Emily (36.6 C)   Ht 5\' 5"  (1.651 m)   Wt 130 lb 12.8 oz (59.3 kg)   SpO2 99%   BMI 21.77  kg/m   Physical Examination:  General: Awake, alert, well nourished, well appearing female, No acute distress MSK: No CVA tenderness.  No palpable or visible rib abnormalities  No results found.  Assessment/ Plan: 75 y.o. female   Rib pain on right side - Plan: DG Abd 1 View, CANCELED: DG Chest 2 View  Personal review of the x-ray demonstrated no evidence of renal stone.  Awaiting formal review by radiology.  She did have a slight scoliotic curve in the back as well as degenerative changes noted in the spine but nothing significant on the rib regions.  Since symptoms are resolving, I do not think that she needs further work-up unless clinical suspicion arises.  We discussed if she develops sudden onset of pain again or any urinary symptoms, low threshold to obtain CT renal stone scan.  She voiced good understanding of plan.  We will follow-up.  No orders of the defined types were placed in this encounter.  No orders of the defined types were placed in this encounter.    Janora Norlander, DO [place] [phone]

## 2021-02-07 LAB — COLOGUARD: Cologuard: NEGATIVE

## 2021-02-25 ENCOUNTER — Encounter: Payer: Self-pay | Admitting: *Deleted

## 2021-03-10 DIAGNOSIS — Z170 Estrogen receptor positive status [ER+]: Secondary | ICD-10-CM | POA: Diagnosis not present

## 2021-03-10 DIAGNOSIS — D0512 Intraductal carcinoma in situ of left breast: Secondary | ICD-10-CM | POA: Diagnosis not present

## 2021-03-10 DIAGNOSIS — C50919 Malignant neoplasm of unspecified site of unspecified female breast: Secondary | ICD-10-CM | POA: Diagnosis not present

## 2021-03-10 DIAGNOSIS — Z79811 Long term (current) use of aromatase inhibitors: Secondary | ICD-10-CM | POA: Diagnosis not present

## 2021-03-10 DIAGNOSIS — D649 Anemia, unspecified: Secondary | ICD-10-CM | POA: Diagnosis not present

## 2021-03-10 DIAGNOSIS — Z9889 Other specified postprocedural states: Secondary | ICD-10-CM | POA: Diagnosis not present

## 2021-05-05 DIAGNOSIS — H2513 Age-related nuclear cataract, bilateral: Secondary | ICD-10-CM | POA: Diagnosis not present

## 2021-05-05 DIAGNOSIS — H524 Presbyopia: Secondary | ICD-10-CM | POA: Diagnosis not present

## 2021-08-16 ENCOUNTER — Encounter: Payer: Self-pay | Admitting: Nurse Practitioner

## 2021-08-16 ENCOUNTER — Ambulatory Visit: Payer: BC Managed Care – PPO | Admitting: Nurse Practitioner

## 2021-08-16 VITALS — BP 134/79 | HR 100 | Temp 97.9°F | Resp 20 | Ht 65.0 in | Wt 133.0 lb

## 2021-08-16 DIAGNOSIS — R300 Dysuria: Secondary | ICD-10-CM

## 2021-08-16 DIAGNOSIS — N3000 Acute cystitis without hematuria: Secondary | ICD-10-CM | POA: Diagnosis not present

## 2021-08-16 LAB — URINALYSIS, COMPLETE
Bilirubin, UA: NEGATIVE
Glucose, UA: NEGATIVE
Ketones, UA: NEGATIVE
Nitrite, UA: NEGATIVE
RBC, UA: NEGATIVE
Specific Gravity, UA: >1.03 — ABNORMAL HIGH (ref 1.005–1.030)
Urobilinogen, Ur: 0.2 mg/dL (ref 0.2–1.0)
pH, UA: 5.5 (ref 5.0–7.5)

## 2021-08-16 LAB — MICROSCOPIC EXAMINATION: Renal Epithel, UA: NONE SEEN /hpf

## 2021-08-16 MED ORDER — CEPHALEXIN 500 MG PO CAPS: 500.0000 mg | ORAL_CAPSULE | Freq: Two times a day (BID) | ORAL | 0 refills | Status: DC

## 2021-08-16 NOTE — Progress Notes (Signed)
? ?  Subjective:  ? ? Patient ID: Emily Doyle, female    DOB: 03-03-1946, 76 y.o.   MRN: [id number] ? ? ?Chief Complaint: Urinary Tract Infection ? ?Patient comes in today c/o dysuria. ? ?Urinary Tract Infection  ?This is a new problem. Episode onset: 4 days ago. The problem occurs every urination. The problem has been waxing and waning. The quality of the pain is described as burning. The pain is at a severity of 0/10. The patient is experiencing no pain. She is Sexually active. There is No history of pyelonephritis. Associated symptoms include frequency and urgency. Pertinent negatives include no discharge, flank pain or hematuria. Treatments tried: AZO. The treatment provided mild relief.  ? ? ? ? ?Review of Systems  ?Genitourinary:  Positive for frequency and urgency. Negative for flank pain and hematuria.  ? ?   ?Objective:  ? Physical Exam ?Vitals reviewed.  ?Constitutional:   ?   Appearance: Normal appearance.  ?Cardiovascular:  ?   Rate and Rhythm: Normal rate and regular rhythm.  ?   Heart sounds: Normal heart sounds.  ?Pulmonary:  ?   Effort: Pulmonary effort is normal.  ?   Breath sounds: Normal breath sounds.  ?Abdominal:  ?   General: Abdomen is flat.  ?   Palpations: Abdomen is soft.  ?   Tenderness: There is no right CVA tenderness or left CVA tenderness.  ?Skin: ?   General: Skin is warm and dry.  ?Neurological:  ?   General: No focal deficit present.  ?   Mental Status: She is alert and oriented to person, place, and time.  ?Psychiatric:     ?   Mood and Affect: Mood normal.     ?   Behavior: Behavior normal.  ? ? ?BP 134/79   Pulse 100   Temp 97.9 ?F (36.6 ?C) (Temporal)   Resp 20   Ht '5\' 5"'$  (1.651 m)   Wt 133 lb (60.3 kg)   SpO2 100%   BMI 22.13 kg/m?  ? ? ? ?   ?Assessment & Plan:  ? ?Emily Doyle in today with chief complaint of Urinary Tract Infection ? ? ?1. Dysuria ?- Urinalysis, Complete ?- Urine Culture ?- Urinalysis, Complete ?- Urine Culture ? ?2. Acute cystitis without  hematuria ?Take medication as prescribe ?Cotton underwear ?Take shower not bath ?Cranberry juice, yogurt ?Force fluids ?AZO over the counter X2 days ?Culture pending ?RTO prn ? ? ? ?The above assessment and management plan was discussed with the patient. The patient verbalized understanding of and has agreed to the management plan. Patient is aware to call the clinic if symptoms persist or worsen. Patient is aware when to return to the clinic for a follow-up visit. Patient educated on when it is appropriate to go to the emergency department.  ? ?Emily Hassell Done, FNP ? ? ?

## 2021-08-16 NOTE — Patient Instructions (Signed)
Take medication as prescribe Cotton underwear Take shower not bath Cranberry juice, yogurt Force fluids AZO over the counter X2 days Culture pending RTO prn  

## 2021-08-19 LAB — URINE CULTURE

## 2021-09-01 DIAGNOSIS — K117 Disturbances of salivary secretion: Secondary | ICD-10-CM | POA: Diagnosis not present

## 2021-09-02 ENCOUNTER — Other Ambulatory Visit: Payer: BC Managed Care – PPO

## 2021-09-02 ENCOUNTER — Ambulatory Visit (INDEPENDENT_AMBULATORY_CARE_PROVIDER_SITE_OTHER): Payer: BC Managed Care – PPO | Admitting: Family Medicine

## 2021-09-02 ENCOUNTER — Telehealth: Payer: Self-pay

## 2021-09-02 DIAGNOSIS — N3020 Other chronic cystitis without hematuria: Secondary | ICD-10-CM

## 2021-09-02 DIAGNOSIS — R300 Dysuria: Secondary | ICD-10-CM | POA: Diagnosis not present

## 2021-09-02 LAB — URINALYSIS, ROUTINE W REFLEX MICROSCOPIC
Bilirubin, UA: NEGATIVE
Glucose, UA: NEGATIVE
Nitrite, UA: POSITIVE — AB
RBC, UA: NEGATIVE
Specific Gravity, UA: 1.02 (ref 1.005–1.030)
Urobilinogen, Ur: 4 mg/dL — ABNORMAL HIGH (ref 0.2–1.0)
pH, UA: 5 (ref 5.0–7.5)

## 2021-09-02 LAB — MICROSCOPIC EXAMINATION
RBC, Urine: NONE SEEN /hpf (ref 0–2)
Renal Epithel, UA: NONE SEEN /hpf
WBC, UA: >30 /hpf — AB (ref 0–5)

## 2021-09-02 MED ORDER — SULFAMETHOXAZOLE-TRIMETHOPRIM 800-160 MG PO TABS: 1.0000 | ORAL_TABLET | Freq: Two times a day (BID) | ORAL | 0 refills | Status: AC

## 2021-09-02 NOTE — Progress Notes (Signed)
Telephone visit ? ?Subjective: ?CC: UTI ?PCP: Janora Norlander, DO ?Emily Doyle is a 76 y.o. female calls for telephone consult today. Patient provides verbal consent for consult held via phone. ? ?Due to COVID-19 pandemic this visit was conducted virtually. This visit type was conducted due to national recommendations for restrictions regarding the COVID-19 Pandemic (e.g. social distancing, sheltering in place) in an effort to limit this patient's exposure and mitigate transmission in our community. All issues noted in this document were discussed and addressed.  A physical exam was not performed with this format.  ? ?Location of patient: Out and about ?Location of provider: [facility] ?Others present for call: None ? ?1.  UTI ?Patient was previously evaluated at the end of April and placed on Keflex for urinary tract symptoms.  She had no significant growth on urine culture.  However, patient reports that symptoms seemed better after completion of Keflex but symptoms returned 2 days ago.  She started having pressure.  She took AZO and did a self urine test at home which showed positive for immediately.  Does not report any malaise, fevers, chills, nausea or vomiting.  Denies any abnormal vaginal discharge or pelvic itching.  Utilizing yogurt for stabilization of gut bacteria ? ?ROS: Per HPI ? ?Allergies  ?Allergen Reactions  ? Other Other (See Comments)  ?  Msg= cramps and diarrhea  ? ?Past Medical History:  ?Diagnosis Date  ? Complication of anesthesia   ? slow to wake up  ? Diverticulosis   ? History of colon polyps   ? HTN (hypertension), benign   ? Low grade myelodysplastic syndrome lesions ([place]) 11/20/2018  ? Migraine with aura   ? Other and unspecified hyperlipidemia   ? Tachycardia   ? Vitamin B 12 deficiency   ? ? ?Current Outpatient Medications:  ?  anastrozole (ARIMIDEX) 1 MG tablet, Take 1 mg by mouth daily., Disp: , Rfl:  ?  cephALEXin (KEFLEX) 500 MG capsule, Take 1 capsule (500 mg total) by  mouth 2 (two) times daily., Disp: 14 capsule, Rfl: 0 ?  Cholecalciferol (VITAMIN D3) 5000 UNITS TABS, Take 5,000 Units by mouth daily. , Disp: , Rfl:  ?  diclofenac Sodium (VOLTAREN) 1 % GEL, Apply 2 g topically 4 (four) times daily., Disp: 150 g, Rfl: 1 ?  losartan (COZAAR) 50 MG tablet, Take 1 tablet (50 mg total) by mouth daily., Disp: 90 tablet, Rfl: 3 ?  Melatonin 10 MG TABS, Take by mouth., Disp: , Rfl:  ?  meloxicam (MOBIC) 15 MG tablet, Take 15 mg by mouth daily., Disp: , Rfl:  ?  methocarbamol (ROBAXIN) 500 MG tablet, Take 0.5-1 tablets (250-500 mg total) by mouth every 6 (six) hours as needed for muscle spasms., Disp: 40 tablet, Rfl: 0 ?  Multiple Vitamins-Minerals (PRESERVISION AREDS 2) CAPS, Take by mouth., Disp: , Rfl:  ?  pravastatin (PRAVACHOL) 40 MG tablet, Take 1.5 tablets qhs, Disp: 135 tablet, Rfl: 3 ?  pyridOXINE (VITAMIN B-6) 100 MG tablet, Take 100 mg by mouth daily., Disp: , Rfl:  ?  UNABLE TO FIND, Med Name: Nerve shield plus, Disp: , Rfl:  ? ?Assessment/ Plan: ?76 y.o. female  ? ?Subacute cystitis - Plan: Urinalysis, Routine w reflex microscopic, Urine Culture ? ?Uncertain if this is an incompletely treated UTI versus recurrent UTI.  I have recollected her urine sample and sent for culture.  She is positive for nitrites and has a greater than 30 white blood cell count under microscopy.  We discussed  that if her symptoms do not totally resolve after this round of antibiotics and/or she has no significant growth on urine culture, I would favor her seeing a urologist for evaluation of the bladder.  She was in agreements of plan and understands red flag signs and symptoms warranting further evaluation.  She will contact me on Monday if symptoms are not improving or if they abruptly get worse ? ?Start time: 11:49a ?End time: 11:55a ? ?Total time spent on patient care (including telephone call/ virtual visit): 6 minutes ? ?Janora Norlander, DO ?[place] ?[phone] ? ?

## 2021-09-02 NOTE — Telephone Encounter (Signed)
Pt scheduled  

## 2021-09-02 NOTE — Addendum Note (Signed)
Addended by: Everlean Cherry on: 09/02/2021 10:38 AM ? ? Modules accepted: Orders ? ?

## 2021-09-02 NOTE — Telephone Encounter (Signed)
Seen mmm 4/25 and felt better and now same issue is back, she is requesting an antibiotic to be sent over. States she took a azo at home test ?

## 2021-09-02 NOTE — Telephone Encounter (Signed)
Needs to be put on phone visit and come in for repeat UA/ UCx ? ?

## 2021-09-04 LAB — URINE CULTURE

## 2021-09-07 ENCOUNTER — Telehealth: Payer: Self-pay

## 2021-09-07 NOTE — Telephone Encounter (Signed)
Again, her urine culture showed NO notable bacterial growth.  Next step is UROLOGY referral, which I am glad to place if she will tell me if she prefers [place] or [place]. ?

## 2021-09-07 NOTE — Telephone Encounter (Signed)
STATES SHE IS STILL NOT FEELING GOOD, STATES SHE JUST FEELS YUCKY. HAS ALMOST FINISHED ALL OF ABX AND THERE IS JUST NO WAY SHE CAN CONTINUE FEELING LIKE THIS. WANTS ADVISE ON WHAT SHE CAN DO  ?

## 2021-09-07 NOTE — Telephone Encounter (Signed)
PT AWARE OF RECOMMENDATIONS- STATES SHE WILL SPEAK TO HER SON AND HE WILL GET HER SENE BY A UROLOGIST WHERE HE WORKS ?

## 2021-09-08 DIAGNOSIS — D649 Anemia, unspecified: Secondary | ICD-10-CM | POA: Diagnosis not present

## 2021-09-08 DIAGNOSIS — Z170 Estrogen receptor positive status [ER+]: Secondary | ICD-10-CM | POA: Diagnosis not present

## 2021-09-08 DIAGNOSIS — N390 Urinary tract infection, site not specified: Secondary | ICD-10-CM | POA: Diagnosis not present

## 2021-09-08 DIAGNOSIS — Z9889 Other specified postprocedural states: Secondary | ICD-10-CM | POA: Diagnosis not present

## 2021-09-08 DIAGNOSIS — D0512 Intraductal carcinoma in situ of left breast: Secondary | ICD-10-CM | POA: Diagnosis not present

## 2021-09-08 DIAGNOSIS — Z79811 Long term (current) use of aromatase inhibitors: Secondary | ICD-10-CM | POA: Diagnosis not present

## 2021-11-04 DIAGNOSIS — I493 Ventricular premature depolarization: Secondary | ICD-10-CM | POA: Diagnosis not present

## 2021-11-04 DIAGNOSIS — I10 Essential (primary) hypertension: Secondary | ICD-10-CM | POA: Diagnosis not present

## 2021-11-04 DIAGNOSIS — I4519 Other right bundle-branch block: Secondary | ICD-10-CM | POA: Diagnosis not present

## 2021-11-04 DIAGNOSIS — D0512 Intraductal carcinoma in situ of left breast: Secondary | ICD-10-CM | POA: Diagnosis not present

## 2021-11-23 DIAGNOSIS — R55 Syncope and collapse: Secondary | ICD-10-CM | POA: Diagnosis not present

## 2021-11-23 DIAGNOSIS — I10 Essential (primary) hypertension: Secondary | ICD-10-CM | POA: Diagnosis not present

## 2022-01-11 DIAGNOSIS — I351 Nonrheumatic aortic (valve) insufficiency: Secondary | ICD-10-CM | POA: Diagnosis not present

## 2022-01-12 DIAGNOSIS — Z853 Personal history of malignant neoplasm of breast: Secondary | ICD-10-CM | POA: Diagnosis not present

## 2022-01-12 DIAGNOSIS — D0512 Intraductal carcinoma in situ of left breast: Secondary | ICD-10-CM | POA: Diagnosis not present

## 2022-01-16 DIAGNOSIS — D0512 Intraductal carcinoma in situ of left breast: Secondary | ICD-10-CM | POA: Diagnosis not present

## 2022-01-25 DIAGNOSIS — I4719 Other supraventricular tachycardia: Secondary | ICD-10-CM | POA: Diagnosis not present

## 2022-01-25 DIAGNOSIS — Z9889 Other specified postprocedural states: Secondary | ICD-10-CM | POA: Diagnosis not present

## 2022-01-25 DIAGNOSIS — I10 Essential (primary) hypertension: Secondary | ICD-10-CM | POA: Diagnosis not present

## 2022-01-25 DIAGNOSIS — R002 Palpitations: Secondary | ICD-10-CM | POA: Diagnosis not present

## 2022-01-26 DIAGNOSIS — H43391 Other vitreous opacities, right eye: Secondary | ICD-10-CM | POA: Diagnosis not present

## 2022-01-26 DIAGNOSIS — H43813 Vitreous degeneration, bilateral: Secondary | ICD-10-CM | POA: Diagnosis not present

## 2022-01-26 DIAGNOSIS — H353122 Nonexudative age-related macular degeneration, left eye, intermediate dry stage: Secondary | ICD-10-CM | POA: Diagnosis not present

## 2022-01-26 DIAGNOSIS — H353113 Nonexudative age-related macular degeneration, right eye, advanced atrophic without subfoveal involvement: Secondary | ICD-10-CM | POA: Diagnosis not present

## 2022-01-31 ENCOUNTER — Other Ambulatory Visit: Payer: Self-pay | Admitting: Family Medicine

## 2022-03-03 ENCOUNTER — Ambulatory Visit: Payer: BC Managed Care – PPO | Admitting: Nurse Practitioner

## 2022-03-03 ENCOUNTER — Encounter: Payer: Self-pay | Admitting: Nurse Practitioner

## 2022-03-03 DIAGNOSIS — R399 Unspecified symptoms and signs involving the genitourinary system: Secondary | ICD-10-CM | POA: Diagnosis not present

## 2022-03-03 MED ORDER — CEPHALEXIN 500 MG PO CAPS: 500.0000 mg | ORAL_CAPSULE | Freq: Two times a day (BID) | ORAL | 0 refills | Status: DC

## 2022-03-03 NOTE — Progress Notes (Signed)
   Virtual Visit  Note Due to COVID-19 pandemic this visit was conducted virtually. This visit type was conducted due to national recommendations for restrictions regarding the COVID-19 Pandemic (e.g. social distancing, sheltering in place) in an effort to limit this patient's exposure and mitigate transmission in our community. All issues noted in this document were discussed and addressed.  A physical exam was not performed with this format.  I connected with Emily Doyle on 03/03/22 at 9:22 by telephone and verified that I am speaking with the correct person using two identifiers. Emily Doyle is currently located at home and non one is currently with her during visit. The provider, Mary-Margaret Hassell Done, FNP is located in their office at time of visit.  I discussed the limitations, risks, security and privacy concerns of performing an evaluation and management service by telephone and the availability of in person appointments. I also discussed with the patient that there may be a patient responsible charge related to this service. The patient expressed understanding and agreed to proceed.   History and Present Illness:  Urinary Tract Infection  This is a new problem. The current episode started in the past 7 days. The problem occurs intermittently. The problem has been gradually worsening. The quality of the pain is described as burning. The pain is at a severity of 6/10. The pain is moderate. There has been no fever. She is Not sexually active. There is No history of pyelonephritis. Associated symptoms include frequency, hesitancy and urgency. Pertinent negatives include no chills or discharge. She has tried nothing for the symptoms.      Review of Systems  Constitutional:  Negative for chills.  Genitourinary:  Positive for frequency, hesitancy and urgency.     Observations/Objective: Alert and oriented- answers all questions appropriately No distress    Assessment and  Plan: Emily Doyle in today with chief complaint of Urinary Tract Infection   1. UTI symptoms Take medication as prescribe Cotton underwear Take shower not bath Cranberry juice, yogurt Force fluids AZO over the counter X2 days RTO prn  Meds ordered this encounter  Medications   cephALEXin (KEFLEX) 500 MG capsule    Sig: Take 1 capsule (500 mg total) by mouth 2 (two) times daily.    Dispense:  14 capsule    Refill:  0    Order Specific Question:   Supervising Provider    Answer:   Caryl Pina A [[id number]]    Follow Up Instructions: prn    I discussed the assessment and treatment plan with the patient. The patient was provided an opportunity to ask questions and all were answered. The patient agreed with the plan and demonstrated an understanding of the instructions.   The patient was advised to call back or seek an in-person evaluation if the symptoms worsen or if the condition fails to improve as anticipated.  The above assessment and management plan was discussed with the patient. The patient verbalized understanding of and has agreed to the management plan. Patient is aware to call the clinic if symptoms persist or worsen. Patient is aware when to return to the clinic for a follow-up visit. Patient educated on when it is appropriate to go to the emergency department.   Time call ended:  9:35  I provided 12 minutes of  non face-to-face time during this encounter.    Mary-Margaret Hassell Done, FNP

## 2022-03-03 NOTE — Patient Instructions (Signed)
Take medication as prescribe Cotton underwear Take shower not bath Cranberry juice, yogurt Force fluids AZO over the counter X2 days Culture pending RTO prn  

## 2022-03-08 ENCOUNTER — Other Ambulatory Visit: Payer: Self-pay | Admitting: Family Medicine

## 2022-03-08 DIAGNOSIS — E7800 Pure hypercholesterolemia, unspecified: Secondary | ICD-10-CM

## 2022-03-08 NOTE — Telephone Encounter (Signed)
[facility] NTBS 30 days given 02/01/22

## 2022-03-09 NOTE — Telephone Encounter (Signed)
I called pt to schedule appt with Dr Lajuana Ripple on 03-10-2022 @ 11:20am to get (x2) RX's refilled.

## 2022-03-10 ENCOUNTER — Encounter: Payer: Self-pay | Admitting: Family Medicine

## 2022-03-10 ENCOUNTER — Ambulatory Visit: Payer: BC Managed Care – PPO | Admitting: Family Medicine

## 2022-03-10 ENCOUNTER — Ambulatory Visit (INDEPENDENT_AMBULATORY_CARE_PROVIDER_SITE_OTHER): Payer: BC Managed Care – PPO

## 2022-03-10 VITALS — BP 114/70 | HR 79 | Temp 97.3°F | Ht 65.0 in | Wt 128.8 lb

## 2022-03-10 DIAGNOSIS — Z780 Asymptomatic menopausal state: Secondary | ICD-10-CM | POA: Diagnosis not present

## 2022-03-10 DIAGNOSIS — G6289 Other specified polyneuropathies: Secondary | ICD-10-CM

## 2022-03-10 DIAGNOSIS — M8589 Other specified disorders of bone density and structure, multiple sites: Secondary | ICD-10-CM | POA: Diagnosis not present

## 2022-03-10 DIAGNOSIS — R300 Dysuria: Secondary | ICD-10-CM | POA: Diagnosis not present

## 2022-03-10 DIAGNOSIS — E7800 Pure hypercholesterolemia, unspecified: Secondary | ICD-10-CM

## 2022-03-10 DIAGNOSIS — I10 Essential (primary) hypertension: Secondary | ICD-10-CM | POA: Diagnosis not present

## 2022-03-10 DIAGNOSIS — D46Z Other myelodysplastic syndromes: Secondary | ICD-10-CM | POA: Diagnosis not present

## 2022-03-10 DIAGNOSIS — D649 Anemia, unspecified: Secondary | ICD-10-CM

## 2022-03-10 DIAGNOSIS — D0512 Intraductal carcinoma in situ of left breast: Secondary | ICD-10-CM

## 2022-03-10 DIAGNOSIS — R6889 Other general symptoms and signs: Secondary | ICD-10-CM | POA: Diagnosis not present

## 2022-03-10 LAB — MICROSCOPIC EXAMINATION
Bacteria, UA: NONE SEEN
RBC, Urine: NONE SEEN /hpf (ref 0–2)
Renal Epithel, UA: NONE SEEN /hpf

## 2022-03-10 LAB — URINALYSIS, ROUTINE W REFLEX MICROSCOPIC
Bilirubin, UA: NEGATIVE
Glucose, UA: NEGATIVE
Leukocytes,UA: NEGATIVE
Nitrite, UA: NEGATIVE
RBC, UA: NEGATIVE
Specific Gravity, UA: 1.025 (ref 1.005–1.030)
Urobilinogen, Ur: 0.2 mg/dL (ref 0.2–1.0)
pH, UA: 6 (ref 5.0–7.5)

## 2022-03-10 MED ORDER — PRAVASTATIN SODIUM 40 MG PO TABS: ORAL_TABLET | ORAL | 3 refills | Status: DC

## 2022-03-10 MED ORDER — LOSARTAN POTASSIUM 50 MG PO TABS: 50.0000 mg | ORAL_TABLET | Freq: Every day | ORAL | 3 refills | Status: DC

## 2022-03-10 NOTE — Addendum Note (Signed)
Addended by: Janora Norlander on: 03/10/2022 11:53 AM   Modules accepted: Orders

## 2022-03-10 NOTE — Progress Notes (Addendum)
Subjective: CC: Follow-up hypertension, hyperlipidemia PCP: Janora Norlander, DO OPF:YTWKM F Cleckley is a 76 y.o. female presenting to clinic today for:  1.  Hypertension associated with hyperlipidemia/ BCA/ neuropathy Compliant with Cozaar, Pravachol.  No chest pain, shortness of breath or visual disturbance reported.  She does suffer from neuropathy in bilateral feet and this has been present for years.  She takes something called nerve shield over-the-counter which she thinks has high doses of B12 in it.  She denies any alcohol use except for occasional red wine.  Never in excess.  Has history of breast cancer and has had some radiation for this in the past.  Currently treated with Arimidex.  Uncertain as to when she had her last DEXA scan.   ROS: Per HPI  Allergies  Allergen Reactions   Other Other (See Comments)    Msg= cramps and diarrhea   Past Medical History:  Diagnosis Date   Complication of anesthesia    slow to wake up   Diverticulosis    History of colon polyps    HTN (hypertension), benign    Low grade myelodysplastic syndrome lesions ([facility]) 11/20/2018   Migraine with aura    Other and unspecified hyperlipidemia    Tachycardia    Vitamin B 12 deficiency     Current Outpatient Medications:    anastrozole (ARIMIDEX) 1 MG tablet, Take 1 mg by mouth daily., Disp: , Rfl:    Cholecalciferol (VITAMIN D3) 5000 UNITS TABS, Take 5,000 Units by mouth daily. , Disp: , Rfl:    diclofenac Sodium (VOLTAREN) 1 % GEL, Apply 2 g topically 4 (four) times daily., Disp: 150 g, Rfl: 1   losartan (COZAAR) 50 MG tablet, Take 1 tablet (50 mg total) by mouth daily. (NEEDS TO BE SEEN BEFORE NEXT REFILL), Disp: 30 tablet, Rfl: 0   Melatonin 10 MG TABS, Take by mouth., Disp: , Rfl:    Multiple Vitamins-Minerals (PRESERVISION AREDS 2) CAPS, Take by mouth., Disp: , Rfl:    pravastatin (PRAVACHOL) 40 MG tablet, Take 1.5 tablets qhs, Disp: 135 tablet, Rfl: 3   pyridOXINE (VITAMIN B-6) 100  MG tablet, Take 100 mg by mouth daily., Disp: , Rfl:    UNABLE TO FIND, Med Name: Nerve shield plus, Disp: , Rfl:    meloxicam (MOBIC) 15 MG tablet, Take 15 mg by mouth daily. (Patient not taking: Reported on 03/10/2022), Disp: , Rfl:  Social History   Socioeconomic History   Marital status: Married    Spouse name: Not on file   Number of children: Not on file   Years of education: Not on file   Highest education level: Not on file  Occupational History   Not on file  Tobacco Use   Smoking status: Former    Packs/day: 1.00    Years: 10.00    Total pack years: 10.00    Types: Cigarettes    Quit date: 04/24/1982    Years since quitting: 39.9   Smokeless tobacco: Never  Vaping Use   Vaping Use: Never used  Substance and Sexual Activity   Alcohol use: Yes    Comment: 2 glass of wine per day   Drug use: No   Sexual activity: Yes    Partners: Male    Birth control/protection: Post-menopausal  Other Topics Concern   Not on file  Social History Narrative   Not on file   Social Determinants of Health   Financial Resource Strain: Not on file  Food Insecurity: Not on  file  Transportation Needs: Not on file  Physical Activity: Not on file  Stress: Not on file  Social Connections: Not on file  Intimate Partner Violence: Not on file   Family History  Problem Relation Age of Onset   Alzheimer's disease Mother    Allergic rhinitis Mother    Cancer Father        prostate   Allergic rhinitis Father    Allergic rhinitis Sister     Objective: Office vital signs reviewed. BP 114/70   Pulse 79   Temp (!) 97.3 F (36.3 C)   Ht 5' 5" (1.651 m)   Wt 128 lb 12.8 oz (58.4 kg)   SpO2 99%   BMI 21.43 kg/m   Physical Examination:  General: Awake, alert, well nourished, No acute distress HEENT: sclera white, MMM Cardio: regular rate and rhythm, S1S2 heard, no murmurs appreciated Pulm: clear to auscultation bilaterally, no wheezes, rhonchi or rales; normal work of breathing on  room air GI: soft, non-tender, non-distended, bowel sounds present x4, no hepatomegaly, no splenomegaly, no masses Extremities: warm, well perfused, No edema, cyanosis or clubbing; +2 pulses bilaterally MSK: Ambulating independent without difficulty Neuro: Decreased vibratory sensation to the ankle but this is intact in the hands  Assessment/ Plan: 76 y.o. female   HTN (hypertension), benign - Plan: CMP14+EGFR, losartan (COZAAR) 50 MG tablet  Pure hypercholesterolemia - Plan: CMP14+EGFR, Lipid Panel, TSH, pravastatin (PRAVACHOL) 40 MG tablet  Low grade myelodysplastic syndrome ([facility]) - Plan: CBC, VITAMIN D 25 Hydroxy (Vit-D Deficiency, Fractures), DG [facility] DEXA  Ductal carcinoma in situ (DCIS) of left breast - Plan: CBC, VITAMIN D 25 Hydroxy (Vit-D Deficiency, Fractures), DG [facility] DEXA  Dysuria - Plan: Urinalysis, Routine w reflex microscopic  Other polyneuropathy - Plan: Vitamin B12  Blood pressure well controlled.  No changes.  Nonfasting labs collected today  We will update DEXA scan given use of Arimidex for breast cancer.  Urinalysis without evidence of ongoing infection but I did recommend that she hydrated she does have slight ketones  Suspect that her polyneuropathy in the feet is likely idiopathic as she has no risk factors otherwise.  She is on a nerve OTC supplement but I will check B12 to ensure that she has appropriate absorption of this.  Offered gabapentin but for now she would like to hold off on it  Orders Placed This Encounter  Procedures   DG [facility] DEXA    Order Specific Question:   Reason for Exam (SYMPTOM  OR DIAGNOSIS REQUIRED)    Answer:   screen osteoporosis   CMP14+EGFR   Lipid Panel   CBC   VITAMIN D 25 Hydroxy (Vit-D Deficiency, Fractures)   TSH   Urinalysis, Routine w reflex microscopic   Vitamin B12   No orders of the defined types were placed in this encounter.    Janora Norlander, DO [place] [phone]

## 2022-03-13 LAB — CMP14+EGFR
ALT: 15 IU/L (ref 0–32)
AST: 16 IU/L (ref 0–40)
Albumin/Globulin Ratio: 2.1 (ref 1.2–2.2)
Albumin: 4.5 g/dL (ref 3.8–4.8)
Alkaline Phosphatase: 80 IU/L (ref 44–121)
BUN/Creatinine Ratio: 20 (ref 12–28)
BUN: 11 mg/dL (ref 8–27)
Bilirubin Total: 0.4 mg/dL (ref 0.0–1.2)
CO2: 23 mmol/L (ref 20–29)
Calcium: 9.4 mg/dL (ref 8.7–10.3)
Chloride: 103 mmol/L (ref 96–106)
Creatinine, Ser: 0.54 mg/dL — ABNORMAL LOW (ref 0.57–1.00)
Globulin, Total: 2.1 g/dL (ref 1.5–4.5)
Glucose: 82 mg/dL (ref 70–99)
Potassium: 4.6 mmol/L (ref 3.5–5.2)
Sodium: 140 mmol/L (ref 134–144)
Total Protein: 6.6 g/dL (ref 6.0–8.5)
eGFR: 95 mL/min/{1.73_m2} (ref >59)

## 2022-03-13 LAB — LIPID PANEL
Chol/HDL Ratio: 2 ratio (ref 0.0–4.4)
Cholesterol, Total: 174 mg/dL (ref 100–199)
HDL: 87 mg/dL (ref >39)
LDL Chol Calc (NIH): 69 mg/dL (ref 0–99)
Triglycerides: 104 mg/dL (ref 0–149)
VLDL Cholesterol Cal: 18 mg/dL (ref 5–40)

## 2022-03-13 LAB — CBC
Hematocrit: 25 % — ABNORMAL LOW (ref 34.0–46.6)
Hemoglobin: 8.2 g/dL — CL (ref 11.1–15.9)
MCH: 31.7 pg (ref 26.6–33.0)
MCHC: 32.8 g/dL (ref 31.5–35.7)
MCV: 97 fL (ref 79–97)
Platelets: 360 10*3/uL (ref 150–450)
RBC: 2.59 x10E6/uL — CL (ref 3.77–5.28)
RDW: 17.4 % — ABNORMAL HIGH (ref 11.7–15.4)
WBC: 3.5 10*3/uL (ref 3.4–10.8)

## 2022-03-13 LAB — VITAMIN B12: Vitamin B-12: 1446 pg/mL — ABNORMAL HIGH (ref 232–1245)

## 2022-03-13 LAB — TSH: TSH: 0.762 u[IU]/mL (ref 0.450–4.500)

## 2022-03-13 LAB — VITAMIN D 25 HYDROXY (VIT D DEFICIENCY, FRACTURES): Vit D, 25-Hydroxy: 75.4 ng/mL (ref 30.0–100.0)

## 2022-03-14 NOTE — Addendum Note (Signed)
Addended by: Everlean Cherry on: 03/14/2022 01:08 PM   Modules accepted: Orders

## 2022-03-20 ENCOUNTER — Other Ambulatory Visit: Payer: BC Managed Care – PPO

## 2022-03-20 DIAGNOSIS — D649 Anemia, unspecified: Secondary | ICD-10-CM

## 2022-03-21 LAB — CBC WITH DIFFERENTIAL/PLATELET
Basophils Absolute: 0.1 10*3/uL (ref 0.0–0.2)
Basos: 3 %
EOS (ABSOLUTE): 0.1 10*3/uL (ref 0.0–0.4)
Eos: 4 %
Hematocrit: 24 % — ABNORMAL LOW (ref 34.0–46.6)
Hemoglobin: 8.2 g/dL — CL (ref 11.1–15.9)
Immature Grans (Abs): 0 10*3/uL (ref 0.0–0.1)
Immature Granulocytes: 0 %
Lymphocytes Absolute: 1.4 10*3/uL (ref 0.7–3.1)
Lymphs: 38 %
MCH: 31.9 pg (ref 26.6–33.0)
MCHC: 34.2 g/dL (ref 31.5–35.7)
MCV: 93 fL (ref 79–97)
Monocytes Absolute: 0.5 10*3/uL (ref 0.1–0.9)
Monocytes: 13 %
Neutrophils Absolute: 1.6 10*3/uL (ref 1.4–7.0)
Neutrophils: 42 %
Platelets: 520 10*3/uL — ABNORMAL HIGH (ref 150–450)
RBC: 2.57 x10E6/uL — CL (ref 3.77–5.28)
RDW: 16.7 % — ABNORMAL HIGH (ref 11.7–15.4)
WBC: 3.7 10*3/uL (ref 3.4–10.8)

## 2022-03-21 LAB — IRON,TIBC AND FERRITIN PANEL
Ferritin: 238 ng/mL — ABNORMAL HIGH (ref 15–150)
Iron Saturation: 52 % (ref 15–55)
Iron: 132 ug/dL (ref 27–139)
Total Iron Binding Capacity: 256 ug/dL (ref 250–450)
UIBC: 124 ug/dL (ref 118–369)

## 2022-03-22 ENCOUNTER — Other Ambulatory Visit: Payer: BC Managed Care – PPO

## 2022-03-22 DIAGNOSIS — Z170 Estrogen receptor positive status [ER+]: Secondary | ICD-10-CM | POA: Diagnosis not present

## 2022-03-22 DIAGNOSIS — Z79811 Long term (current) use of aromatase inhibitors: Secondary | ICD-10-CM | POA: Diagnosis not present

## 2022-03-22 DIAGNOSIS — D0512 Intraductal carcinoma in situ of left breast: Secondary | ICD-10-CM | POA: Diagnosis not present

## 2022-03-22 DIAGNOSIS — D649 Anemia, unspecified: Secondary | ICD-10-CM | POA: Diagnosis not present

## 2022-03-22 DIAGNOSIS — N390 Urinary tract infection, site not specified: Secondary | ICD-10-CM | POA: Diagnosis not present

## 2022-03-23 LAB — FECAL OCCULT BLOOD, IMMUNOCHEMICAL: Fecal Occult Bld: NEGATIVE

## 2022-04-06 DIAGNOSIS — N390 Urinary tract infection, site not specified: Secondary | ICD-10-CM | POA: Diagnosis not present

## 2022-04-06 DIAGNOSIS — D649 Anemia, unspecified: Secondary | ICD-10-CM | POA: Diagnosis not present

## 2022-04-06 DIAGNOSIS — D0512 Intraductal carcinoma in situ of left breast: Secondary | ICD-10-CM | POA: Diagnosis not present

## 2022-04-06 DIAGNOSIS — D0510 Intraductal carcinoma in situ of unspecified breast: Secondary | ICD-10-CM | POA: Diagnosis not present

## 2022-05-10 DIAGNOSIS — D46C Myelodysplastic syndrome with isolated del(5q) chromosomal abnormality: Secondary | ICD-10-CM | POA: Diagnosis not present

## 2022-05-10 DIAGNOSIS — D649 Anemia, unspecified: Secondary | ICD-10-CM | POA: Diagnosis not present

## 2022-05-10 DIAGNOSIS — D469 Myelodysplastic syndrome, unspecified: Secondary | ICD-10-CM | POA: Diagnosis not present

## 2022-05-10 DIAGNOSIS — D4620 Refractory anemia with excess of blasts, unspecified: Secondary | ICD-10-CM | POA: Diagnosis not present

## 2022-05-24 DIAGNOSIS — D469 Myelodysplastic syndrome, unspecified: Secondary | ICD-10-CM | POA: Diagnosis not present

## 2022-05-24 DIAGNOSIS — D0512 Intraductal carcinoma in situ of left breast: Secondary | ICD-10-CM | POA: Diagnosis not present

## 2022-05-24 DIAGNOSIS — D649 Anemia, unspecified: Secondary | ICD-10-CM | POA: Diagnosis not present

## 2022-05-24 DIAGNOSIS — N390 Urinary tract infection, site not specified: Secondary | ICD-10-CM | POA: Diagnosis not present

## 2022-05-24 DIAGNOSIS — Z170 Estrogen receptor positive status [ER+]: Secondary | ICD-10-CM | POA: Diagnosis not present

## 2022-06-09 DIAGNOSIS — H5202 Hypermetropia, left eye: Secondary | ICD-10-CM | POA: Diagnosis not present

## 2022-06-09 DIAGNOSIS — H2513 Age-related nuclear cataract, bilateral: Secondary | ICD-10-CM | POA: Diagnosis not present

## 2022-06-26 DIAGNOSIS — Z79811 Long term (current) use of aromatase inhibitors: Secondary | ICD-10-CM | POA: Diagnosis not present

## 2022-06-26 DIAGNOSIS — R300 Dysuria: Secondary | ICD-10-CM | POA: Diagnosis not present

## 2022-06-26 DIAGNOSIS — Z5181 Encounter for therapeutic drug level monitoring: Secondary | ICD-10-CM | POA: Diagnosis not present

## 2022-06-26 DIAGNOSIS — Z7961 Long term (current) use of immunomodulator: Secondary | ICD-10-CM | POA: Diagnosis not present

## 2022-06-26 DIAGNOSIS — D469 Myelodysplastic syndrome, unspecified: Secondary | ICD-10-CM | POA: Diagnosis not present

## 2022-06-26 DIAGNOSIS — N3000 Acute cystitis without hematuria: Secondary | ICD-10-CM | POA: Diagnosis not present

## 2022-07-28 DIAGNOSIS — R5383 Other fatigue: Secondary | ICD-10-CM | POA: Diagnosis not present

## 2022-07-28 DIAGNOSIS — D6481 Anemia due to antineoplastic chemotherapy: Secondary | ICD-10-CM | POA: Diagnosis not present

## 2022-07-28 DIAGNOSIS — E162 Hypoglycemia, unspecified: Secondary | ICD-10-CM | POA: Diagnosis not present

## 2022-07-28 DIAGNOSIS — D630 Anemia in neoplastic disease: Secondary | ICD-10-CM | POA: Diagnosis not present

## 2022-07-28 DIAGNOSIS — I959 Hypotension, unspecified: Secondary | ICD-10-CM | POA: Diagnosis not present

## 2022-07-28 DIAGNOSIS — Z923 Personal history of irradiation: Secondary | ICD-10-CM | POA: Diagnosis not present

## 2022-07-28 DIAGNOSIS — R7303 Prediabetes: Secondary | ICD-10-CM | POA: Diagnosis not present

## 2022-07-28 DIAGNOSIS — Z79899 Other long term (current) drug therapy: Secondary | ICD-10-CM | POA: Diagnosis not present

## 2022-07-28 DIAGNOSIS — C50911 Malignant neoplasm of unspecified site of right female breast: Secondary | ICD-10-CM | POA: Diagnosis not present

## 2022-07-28 DIAGNOSIS — R0602 Shortness of breath: Secondary | ICD-10-CM | POA: Diagnosis not present

## 2022-07-28 DIAGNOSIS — R7989 Other specified abnormal findings of blood chemistry: Secondary | ICD-10-CM | POA: Diagnosis not present

## 2022-07-28 DIAGNOSIS — D649 Anemia, unspecified: Secondary | ICD-10-CM | POA: Diagnosis not present

## 2022-07-28 DIAGNOSIS — T451X5A Adverse effect of antineoplastic and immunosuppressive drugs, initial encounter: Secondary | ICD-10-CM | POA: Diagnosis not present

## 2022-07-28 DIAGNOSIS — D469 Myelodysplastic syndrome, unspecified: Secondary | ICD-10-CM | POA: Diagnosis not present

## 2022-07-29 DIAGNOSIS — D649 Anemia, unspecified: Secondary | ICD-10-CM | POA: Diagnosis not present

## 2022-08-01 ENCOUNTER — Telehealth: Payer: Self-pay

## 2022-08-01 DIAGNOSIS — Z5181 Encounter for therapeutic drug level monitoring: Secondary | ICD-10-CM | POA: Diagnosis not present

## 2022-08-01 DIAGNOSIS — Z7961 Long term (current) use of immunomodulator: Secondary | ICD-10-CM | POA: Diagnosis not present

## 2022-08-01 DIAGNOSIS — D469 Myelodysplastic syndrome, unspecified: Secondary | ICD-10-CM | POA: Diagnosis not present

## 2022-08-01 NOTE — Transitions of Care (Post Inpatient/ED Visit) (Signed)
   08/01/2022  Name: Emily Doyle MRN: [id number] DOB: Dec 15, 1945  Today's TOC FU Call Status: Today's TOC FU Call Status:: Unsuccessul Call (1st Attempt) Unsuccessful Call (1st Attempt) Date: 08/01/22  Attempted to reach the patient regarding the most recent Inpatient/ED visit.  Follow Up Plan: Additional outreach attempts will be made to reach the patient to complete the Transitions of Care (Post Inpatient/ED visit) call.   Jodelle Gross, RN, BSN, CCM Care Management Coordinator [facility] Phone: [phone]/Fax: [phone]

## 2022-08-02 ENCOUNTER — Telehealth: Payer: Self-pay

## 2022-08-08 DIAGNOSIS — D469 Myelodysplastic syndrome, unspecified: Secondary | ICD-10-CM | POA: Diagnosis not present

## 2022-08-08 DIAGNOSIS — D649 Anemia, unspecified: Secondary | ICD-10-CM | POA: Diagnosis not present

## 2022-08-08 DIAGNOSIS — Z5181 Encounter for therapeutic drug level monitoring: Secondary | ICD-10-CM | POA: Diagnosis not present

## 2022-08-08 DIAGNOSIS — R5383 Other fatigue: Secondary | ICD-10-CM | POA: Diagnosis not present

## 2022-08-09 DIAGNOSIS — D0512 Intraductal carcinoma in situ of left breast: Secondary | ICD-10-CM | POA: Diagnosis not present

## 2022-08-09 DIAGNOSIS — D469 Myelodysplastic syndrome, unspecified: Secondary | ICD-10-CM | POA: Diagnosis not present

## 2022-08-15 DIAGNOSIS — D469 Myelodysplastic syndrome, unspecified: Secondary | ICD-10-CM | POA: Diagnosis not present

## 2022-08-21 DIAGNOSIS — Z7961 Long term (current) use of immunomodulator: Secondary | ICD-10-CM | POA: Diagnosis not present

## 2022-08-21 DIAGNOSIS — R300 Dysuria: Secondary | ICD-10-CM | POA: Diagnosis not present

## 2022-08-21 DIAGNOSIS — D469 Myelodysplastic syndrome, unspecified: Secondary | ICD-10-CM | POA: Diagnosis not present

## 2022-08-21 DIAGNOSIS — Z5181 Encounter for therapeutic drug level monitoring: Secondary | ICD-10-CM | POA: Diagnosis not present

## 2022-08-23 DIAGNOSIS — D649 Anemia, unspecified: Secondary | ICD-10-CM | POA: Diagnosis not present

## 2022-08-23 DIAGNOSIS — D46Z Other myelodysplastic syndromes: Secondary | ICD-10-CM | POA: Diagnosis not present

## 2022-08-23 DIAGNOSIS — D469 Myelodysplastic syndrome, unspecified: Secondary | ICD-10-CM | POA: Diagnosis not present

## 2022-08-25 DIAGNOSIS — D469 Myelodysplastic syndrome, unspecified: Secondary | ICD-10-CM | POA: Diagnosis not present

## 2022-08-25 DIAGNOSIS — D649 Anemia, unspecified: Secondary | ICD-10-CM | POA: Diagnosis not present

## 2022-08-29 DIAGNOSIS — D649 Anemia, unspecified: Secondary | ICD-10-CM | POA: Diagnosis not present

## 2022-08-29 DIAGNOSIS — D469 Myelodysplastic syndrome, unspecified: Secondary | ICD-10-CM | POA: Diagnosis not present

## 2022-09-05 DIAGNOSIS — D469 Myelodysplastic syndrome, unspecified: Secondary | ICD-10-CM | POA: Diagnosis not present

## 2022-09-13 DIAGNOSIS — D469 Myelodysplastic syndrome, unspecified: Secondary | ICD-10-CM | POA: Diagnosis not present

## 2022-10-09 DIAGNOSIS — D469 Myelodysplastic syndrome, unspecified: Secondary | ICD-10-CM | POA: Diagnosis not present

## 2022-10-09 DIAGNOSIS — R630 Anorexia: Secondary | ICD-10-CM | POA: Diagnosis not present

## 2022-10-30 DIAGNOSIS — D469 Myelodysplastic syndrome, unspecified: Secondary | ICD-10-CM | POA: Diagnosis not present

## 2022-11-27 DIAGNOSIS — Z5181 Encounter for therapeutic drug level monitoring: Secondary | ICD-10-CM | POA: Diagnosis not present

## 2022-11-27 DIAGNOSIS — D469 Myelodysplastic syndrome, unspecified: Secondary | ICD-10-CM | POA: Diagnosis not present

## 2022-11-27 DIAGNOSIS — Z7961 Long term (current) use of immunomodulator: Secondary | ICD-10-CM | POA: Diagnosis not present

## 2022-12-26 DIAGNOSIS — Z5181 Encounter for therapeutic drug level monitoring: Secondary | ICD-10-CM | POA: Diagnosis not present

## 2022-12-26 DIAGNOSIS — D469 Myelodysplastic syndrome, unspecified: Secondary | ICD-10-CM | POA: Diagnosis not present

## 2022-12-26 DIAGNOSIS — Z7961 Long term (current) use of immunomodulator: Secondary | ICD-10-CM | POA: Diagnosis not present

## 2023-01-15 DIAGNOSIS — Z853 Personal history of malignant neoplasm of breast: Secondary | ICD-10-CM | POA: Diagnosis not present

## 2023-01-15 DIAGNOSIS — R92333 Mammographic heterogeneous density, bilateral breasts: Secondary | ICD-10-CM | POA: Diagnosis not present

## 2023-01-15 DIAGNOSIS — D0512 Intraductal carcinoma in situ of left breast: Secondary | ICD-10-CM | POA: Diagnosis not present

## 2023-01-17 DIAGNOSIS — D0512 Intraductal carcinoma in situ of left breast: Secondary | ICD-10-CM | POA: Diagnosis not present

## 2023-01-22 DIAGNOSIS — Z5181 Encounter for therapeutic drug level monitoring: Secondary | ICD-10-CM | POA: Diagnosis not present

## 2023-01-22 DIAGNOSIS — Z7961 Long term (current) use of immunomodulator: Secondary | ICD-10-CM | POA: Diagnosis not present

## 2023-01-22 DIAGNOSIS — D469 Myelodysplastic syndrome, unspecified: Secondary | ICD-10-CM | POA: Diagnosis not present

## 2023-02-01 DIAGNOSIS — H43391 Other vitreous opacities, right eye: Secondary | ICD-10-CM | POA: Diagnosis not present

## 2023-02-01 DIAGNOSIS — H353114 Nonexudative age-related macular degeneration, right eye, advanced atrophic with subfoveal involvement: Secondary | ICD-10-CM | POA: Diagnosis not present

## 2023-02-01 DIAGNOSIS — H353122 Nonexudative age-related macular degeneration, left eye, intermediate dry stage: Secondary | ICD-10-CM | POA: Diagnosis not present

## 2023-02-01 DIAGNOSIS — H43813 Vitreous degeneration, bilateral: Secondary | ICD-10-CM | POA: Diagnosis not present

## 2023-02-15 DIAGNOSIS — H353133 Nonexudative age-related macular degeneration, bilateral, advanced atrophic without subfoveal involvement: Secondary | ICD-10-CM | POA: Diagnosis not present

## 2023-02-15 DIAGNOSIS — H5202 Hypermetropia, left eye: Secondary | ICD-10-CM | POA: Diagnosis not present

## 2023-02-19 DIAGNOSIS — Z7961 Long term (current) use of immunomodulator: Secondary | ICD-10-CM | POA: Diagnosis not present

## 2023-02-19 DIAGNOSIS — D469 Myelodysplastic syndrome, unspecified: Secondary | ICD-10-CM | POA: Diagnosis not present

## 2023-02-19 DIAGNOSIS — Z5181 Encounter for therapeutic drug level monitoring: Secondary | ICD-10-CM | POA: Diagnosis not present

## 2023-02-26 DIAGNOSIS — D469 Myelodysplastic syndrome, unspecified: Secondary | ICD-10-CM | POA: Diagnosis not present

## 2023-03-01 DIAGNOSIS — H2513 Age-related nuclear cataract, bilateral: Secondary | ICD-10-CM | POA: Diagnosis not present

## 2023-03-19 DIAGNOSIS — D469 Myelodysplastic syndrome, unspecified: Secondary | ICD-10-CM | POA: Diagnosis not present

## 2023-04-12 ENCOUNTER — Other Ambulatory Visit: Payer: Self-pay | Admitting: Family Medicine

## 2023-04-12 DIAGNOSIS — E7800 Pure hypercholesterolemia, unspecified: Secondary | ICD-10-CM

## 2023-04-13 ENCOUNTER — Encounter: Payer: Self-pay | Admitting: Family Medicine

## 2023-04-13 NOTE — Telephone Encounter (Signed)
LMTCB to make an appt w/[facility] to get meds refilled & I sent pt a letter about this.

## 2023-04-13 NOTE — Telephone Encounter (Signed)
[facility] NTBS Last OV 03/10/22 NO RF sent to pharmacy last OV greater than a year

## 2023-04-27 DIAGNOSIS — D469 Myelodysplastic syndrome, unspecified: Secondary | ICD-10-CM | POA: Diagnosis not present

## 2023-04-30 ENCOUNTER — Ambulatory Visit: Payer: BC Managed Care – PPO | Admitting: Nurse Practitioner

## 2023-05-01 ENCOUNTER — Ambulatory Visit: Payer: BC Managed Care – PPO | Admitting: Nurse Practitioner

## 2023-05-01 ENCOUNTER — Ambulatory Visit: Payer: BC Managed Care – PPO

## 2023-05-01 ENCOUNTER — Other Ambulatory Visit: Payer: BC Managed Care – PPO

## 2023-05-01 ENCOUNTER — Other Ambulatory Visit: Payer: Self-pay | Admitting: Family Medicine

## 2023-05-01 DIAGNOSIS — D46Z Other myelodysplastic syndromes: Secondary | ICD-10-CM

## 2023-05-01 DIAGNOSIS — E7800 Pure hypercholesterolemia, unspecified: Secondary | ICD-10-CM

## 2023-05-01 DIAGNOSIS — R6889 Other general symptoms and signs: Secondary | ICD-10-CM | POA: Diagnosis not present

## 2023-05-01 DIAGNOSIS — I10 Essential (primary) hypertension: Secondary | ICD-10-CM

## 2023-05-02 LAB — CBC
Hematocrit: 38.5 % (ref 34.0–46.6)
Hemoglobin: 12.5 g/dL (ref 11.1–15.9)
MCH: 35.3 pg — ABNORMAL HIGH (ref 26.6–33.0)
MCHC: 32.5 g/dL (ref 31.5–35.7)
MCV: 109 fL — ABNORMAL HIGH (ref 79–97)
Platelets: 154 10*3/uL (ref 150–450)
RBC: 3.54 x10E6/uL — ABNORMAL LOW (ref 3.77–5.28)
RDW: 12.4 % (ref 11.7–15.4)
WBC: 3.6 10*3/uL (ref 3.4–10.8)

## 2023-05-02 LAB — VITAMIN D 25 HYDROXY (VIT D DEFICIENCY, FRACTURES): Vit D, 25-Hydroxy: 70.8 ng/mL (ref 30.0–100.0)

## 2023-05-02 LAB — CMP14+EGFR
ALT: 16 [IU]/L (ref 0–32)
AST: 22 [IU]/L (ref 0–40)
Albumin: 4.2 g/dL (ref 3.8–4.8)
Alkaline Phosphatase: 81 [IU]/L (ref 44–121)
BUN/Creatinine Ratio: 15 (ref 12–28)
BUN: 9 mg/dL (ref 8–27)
Bilirubin Total: 0.7 mg/dL (ref 0.0–1.2)
CO2: 24 mmol/L (ref 20–29)
Calcium: 9.5 mg/dL (ref 8.7–10.3)
Chloride: 104 mmol/L (ref 96–106)
Creatinine, Ser: 0.62 mg/dL (ref 0.57–1.00)
Globulin, Total: 2.2 g/dL (ref 1.5–4.5)
Glucose: 96 mg/dL (ref 70–99)
Potassium: 4 mmol/L (ref 3.5–5.2)
Sodium: 142 mmol/L (ref 134–144)
Total Protein: 6.4 g/dL (ref 6.0–8.5)
eGFR: 92 mL/min/{1.73_m2} (ref >59)

## 2023-05-02 LAB — LIPID PANEL
Chol/HDL Ratio: 2.1 {ratio} (ref 0.0–4.4)
Cholesterol, Total: 212 mg/dL — ABNORMAL HIGH (ref 100–199)
HDL: 101 mg/dL (ref >39)
LDL Chol Calc (NIH): 92 mg/dL (ref 0–99)
Triglycerides: 111 mg/dL (ref 0–149)
VLDL Cholesterol Cal: 19 mg/dL (ref 5–40)

## 2023-05-09 DIAGNOSIS — H25811 Combined forms of age-related cataract, right eye: Secondary | ICD-10-CM | POA: Diagnosis not present

## 2023-05-09 DIAGNOSIS — H2511 Age-related nuclear cataract, right eye: Secondary | ICD-10-CM | POA: Diagnosis not present

## 2023-05-09 HISTORY — PX: CATARACT EXTRACTION: SUR2

## 2023-05-11 ENCOUNTER — Encounter: Payer: Self-pay | Admitting: Family Medicine

## 2023-05-11 ENCOUNTER — Ambulatory Visit (INDEPENDENT_AMBULATORY_CARE_PROVIDER_SITE_OTHER): Payer: BC Managed Care – PPO | Admitting: Family Medicine

## 2023-05-11 VITALS — BP 128/73 | HR 90 | Temp 98.5°F | Ht 65.0 in | Wt 122.0 lb

## 2023-05-11 DIAGNOSIS — Z23 Encounter for immunization: Secondary | ICD-10-CM | POA: Diagnosis not present

## 2023-05-11 DIAGNOSIS — H9193 Unspecified hearing loss, bilateral: Secondary | ICD-10-CM

## 2023-05-11 DIAGNOSIS — D46Z Other myelodysplastic syndromes: Secondary | ICD-10-CM | POA: Diagnosis not present

## 2023-05-11 DIAGNOSIS — Z0000 Encounter for general adult medical examination without abnormal findings: Secondary | ICD-10-CM

## 2023-05-11 DIAGNOSIS — Z1211 Encounter for screening for malignant neoplasm of colon: Secondary | ICD-10-CM

## 2023-05-11 DIAGNOSIS — D0512 Intraductal carcinoma in situ of left breast: Secondary | ICD-10-CM

## 2023-05-11 DIAGNOSIS — Z0001 Encounter for general adult medical examination with abnormal findings: Secondary | ICD-10-CM

## 2023-05-11 DIAGNOSIS — E7800 Pure hypercholesterolemia, unspecified: Secondary | ICD-10-CM

## 2023-05-11 DIAGNOSIS — I10 Essential (primary) hypertension: Secondary | ICD-10-CM

## 2023-05-11 MED ORDER — LOSARTAN POTASSIUM 50 MG PO TABS: 50.0000 mg | ORAL_TABLET | Freq: Every day | ORAL | 3 refills | Status: AC

## 2023-05-11 MED ORDER — PRAVASTATIN SODIUM 40 MG PO TABS: ORAL_TABLET | ORAL | 3 refills | Status: AC

## 2023-05-11 NOTE — Progress Notes (Signed)
Emily Doyle is a 78 y.o. female presents to office today for annual physical exam examination.    Concerns today include: 1.  None.  She reports that she has been doing fairly well.  She had a cataract surgery done on the right eye and will be expecting to have the second 1 done next month.  Occupation: retired, Marital status: married, Substance use: none Health Maintenance Due  Topic Date Due   DTaP/Tdap/Td (2 - Tdap) 12/23/2013   Zoster Vaccines- Shingrix (2 of 2) 05/06/2020   INFLUENZA VACCINE  11/23/2022   COVID-19 Vaccine (7 - 2024-25 season) 12/24/2022   Refills needed today: all  Immunization History  Administered Date(s) Administered   Influenza Split 02/17/2013, 02/20/2014, 03/15/2015, 03/06/2016, 02/26/2017   Influenza Whole 02/22/2010   Influenza, High Dose Seasonal PF 02/12/2018, 02/24/2019   Influenza,inj,Quad PF,6+ Mos 02/17/2013   Influenza-Unspecified 02/17/2013, 02/20/2014, 03/15/2015, 03/06/2016, 02/26/2017, 02/19/2020   PFIZER Comirnaty(Gray Top)Covid-19 Tri-Sucrose Vaccine 05/19/2019, 06/09/2019, 01/29/2020   PFIZER(Purple Top)SARS-COV-2 Vaccination 05/19/2019, 06/09/2019, 01/29/2020   PNEUMOCOCCAL CONJUGATE-20 01/24/2021   Pneumococcal Conjugate-13 12/02/2019   Td 12/24/2003   Zoster Recombinant(Shingrix) 03/11/2020   Zoster, Live 02/11/2014   Zoster, Unspecified 03/11/2020   Past Medical History:  Diagnosis Date   Complication of anesthesia    slow to wake up   Diverticulosis    History of colon polyps    HTN (hypertension), benign    Low grade myelodysplastic syndrome lesions (HCC) 11/20/2018   Migraine with aura    Other and unspecified hyperlipidemia    Tachycardia    Vitamin B 12 deficiency    Social History   Socioeconomic History   Marital status: Married    Spouse name: Not on file   Number of children: Not on file   Years of education: Not on file   Highest education level: Bachelor's degree (e.g., BA, AB, BS)  Occupational  History   Not on file  Tobacco Use   Smoking status: Former    Current packs/day: 0.00    Average packs/day: 1 pack/day for 10.0 years (10.0 ttl pk-yrs)    Types: Cigarettes    Start date: 04/24/1972    Quit date: 04/24/1982    Years since quitting: 41.0   Smokeless tobacco: Never  Vaping Use   Vaping status: Never Used  Substance and Sexual Activity   Alcohol use: Yes    Comment: 2 glass of wine per day   Drug use: No   Sexual activity: Yes    Partners: Male    Birth control/protection: Post-menopausal  Other Topics Concern   Not on file  Social History Narrative   Not on file   Social Drivers of Health   Financial Resource Strain: Low Risk  (05/08/2023)   Overall Financial Resource Strain (CARDIA)    Difficulty of Paying Living Expenses: Not hard at all  Food Insecurity: No Food Insecurity (05/08/2023)   Hunger Vital Sign    Worried About Running Out of Food in the Last Year: Never true    Ran Out of Food in the Last Year: Never true  Transportation Needs: No Transportation Needs (05/08/2023)   PRAPARE - Administrator, Civil Service (Medical): No    Lack of Transportation (Non-Medical): No  Physical Activity: Insufficiently Active (05/08/2023)   Exercise Vital Sign    Days of Exercise per Week: 2 days    Minutes of Exercise per Session: 20 min  Stress: No Stress Concern Present (05/08/2023)   Emily Doyle  of Occupational Health - Occupational Stress Questionnaire    Feeling of Stress : Not at all  Social Connections: Moderately Isolated (05/08/2023)   Social Connection and Isolation Panel [NHANES]    Frequency of Communication with Friends and Family: More than three times a week    Frequency of Social Gatherings with Friends and Family: Twice a week    Attends Religious Services: Never    Database administrator or Organizations: No    Attends Engineer, structural: Not on file    Marital Status: Married  Catering manager Violence: Not on file    Past Surgical History:  Procedure Laterality Date   ablation for tachacardia  2012   BREAST CYST ASPIRATION  2000   INGUINAL HERNIA REPAIR Right 02/05/2013   Procedure: HERNIA REPAIR femoral ADULT;  Surgeon: Kandis Cocking, MD;  Location: [facility];  Service: General;  Laterality: Right;  with MESH   spinal block     for right hip / leg pain   Family History  Problem Relation Age of Onset   Alzheimer's disease Mother    Allergic rhinitis Mother    Cancer Father        prostate   Allergic rhinitis Father    Allergic rhinitis Sister     Current Outpatient Medications:    anastrozole (ARIMIDEX) 1 MG tablet, Take 1 mg by mouth daily., Disp: , Rfl:    Cholecalciferol (VITAMIN D3) 5000 UNITS TABS, Take 5,000 Units by mouth daily. , Disp: , Rfl:    diclofenac Sodium (VOLTAREN) 1 % GEL, Apply 2 g topically 4 (four) times daily., Disp: 150 g, Rfl: 1   losartan (COZAAR) 50 MG tablet, Take 1 tablet (50 mg total) by mouth daily., Disp: 90 tablet, Rfl: 3   Melatonin 10 MG TABS, Take by mouth., Disp: , Rfl:    meloxicam (MOBIC) 15 MG tablet, Take 15 mg by mouth daily. (Patient not taking: Reported on 03/10/2022), Disp: , Rfl:    Multiple Vitamins-Minerals (PRESERVISION AREDS 2) CAPS, Take by mouth., Disp: , Rfl:    pravastatin (PRAVACHOL) 40 MG tablet, Take 1.5 tablets qhs, Disp: 135 tablet, Rfl: 3   pyridOXINE (VITAMIN B-6) 100 MG tablet, Take 100 mg by mouth daily., Disp: , Rfl:    UNABLE TO FIND, Med Name: Nerve shield plus, Disp: , Rfl:   Allergies  Allergen Reactions   Other Other (See Comments)    Msg= cramps and diarrhea     ROS: Review of Systems Pertinent items noted in HPI and remainder of comprehensive ROS otherwise negative.    Physical exam BP 128/73   Pulse 90   Temp 98.5 F (36.9 C)   Ht 5\' 5"  (1.651 m)   Wt 122 lb (55.3 kg)   SpO2 97%   BMI 20.30 kg/m  General appearance: alert, cooperative, appears stated age, and no distress Head: Normocephalic, without  obvious abnormality, atraumatic Eyes: negative findings: lids and lashes normal, conjunctivae and sclerae normal, corneas clear, and pupils equal, round, reactive to light and accomodation Ears: normal TM's and external ear canals both ears Nose: Nares normal. Septum midline. Mucosa normal. No drainage or sinus tenderness. Throat: lips, mucosa, and tongue normal; teeth and gums normal Neck: no adenopathy, supple, symmetrical, trachea midline, and thyroid not enlarged, symmetric, no tenderness/mass/nodules Back: symmetric, no curvature. ROM normal. No CVA tenderness. Lungs: clear to auscultation bilaterally Heart: regular rate and rhythm, S1, S2 normal, no murmur, click, rub or gallop Abdomen: soft, non-tender; bowel sounds  normal; no masses,  no organomegaly Extremities: extremities normal, atraumatic, no cyanosis or edema Pulses: 2+ and symmetric Skin:  Purpura noted along the forearms Lymph nodes: Cervical, supraclavicular, and axillary nodes normal. Neurologic: Grossly normal, somewhat hard of hearing     05/11/2023    2:54 PM 03/10/2022   11:30 AM 08/16/2021    9:26 AM  Depression screen PHQ 2/9  Decreased Interest 0 0 0  Down, Depressed, Hopeless 0 0 0  PHQ - 2 Score 0 0 0  Altered sleeping 0  0  Tired, decreased energy 0  0  Change in appetite 0  1  Feeling bad or failure about yourself  0  0  Trouble concentrating 0  0  Moving slowly or fidgety/restless 0  0  Suicidal thoughts 0  0  PHQ-9 Score 0  1  Difficult doing work/chores Not difficult at all  Not difficult at all      05/11/2023    2:54 PM 03/10/2022   11:30 AM 08/16/2021    9:27 AM 01/26/2021    2:16 PM  GAD 7 : Generalized Anxiety Score  Nervous, Anxious, on Edge 0 0 0 0  Control/stop worrying 0 0 0 0  Worry too much - different things 0 0 0 0  Trouble relaxing 0 0 0 0  Restless 0 0 0 0  Easily annoyed or irritable 0 0 0 0  Afraid - awful might happen 0 0 0 0  Total GAD 7 Score 0 0 0 0  Anxiety Difficulty  Not difficult at all Not difficult at all Not difficult at all Not difficult at all    Recent Results (from the past 2160 hours)  CBC     Status: Abnormal   Collection Time: 05/01/23 12:08 PM  Result Value Ref Range   WBC 3.6 3.4 - 10.8 x10E3/uL   RBC 3.54 (L) 3.77 - 5.28 x10E6/uL   Hemoglobin 12.5 11.1 - 15.9 g/dL   Hematocrit 16.1 09.6 - 46.6 %   MCV 109 (H) 79 - 97 fL   MCH 35.3 (H) 26.6 - 33.0 pg   MCHC 32.5 31.5 - 35.7 g/dL   RDW 04.5 40.9 - 81.1 %   Platelets 154 150 - 450 x10E3/uL  CMP14+EGFR     Status: None   Collection Time: 05/01/23 12:08 PM  Result Value Ref Range   Glucose 96 70 - 99 mg/dL   BUN 9 8 - 27 mg/dL   Creatinine, Ser 9.14 0.57 - 1.00 mg/dL   eGFR 92 >78 GN/FAO/1.30   BUN/Creatinine Ratio 15 12 - 28   Sodium 142 134 - 144 mmol/L   Potassium 4.0 3.5 - 5.2 mmol/L   Chloride 104 96 - 106 mmol/L   CO2 24 20 - 29 mmol/L   Calcium 9.5 8.7 - 10.3 mg/dL   Total Protein 6.4 6.0 - 8.5 g/dL   Albumin 4.2 3.8 - 4.8 g/dL   Globulin, Total 2.2 1.5 - 4.5 g/dL   Bilirubin Total 0.7 0.0 - 1.2 mg/dL   Alkaline Phosphatase 81 44 - 121 IU/L   AST 22 0 - 40 IU/L   ALT 16 0 - 32 IU/L  Lipid panel     Status: Abnormal   Collection Time: 05/01/23 12:08 PM  Result Value Ref Range   Cholesterol, Total 212 (H) 100 - 199 mg/dL   Triglycerides 865 0 - 149 mg/dL   HDL 784 >69 mg/dL   VLDL Cholesterol Cal 19 5 - 40 mg/dL  LDL Chol Calc (NIH) 92 0 - 99 mg/dL   Chol/HDL Ratio 2.1 0.0 - 4.4 ratio    Comment:                                   T. Chol/HDL Ratio                                             Men  Women                               1/2 Avg.Risk  3.4    3.3                                   Avg.Risk  5.0    4.4                                2X Avg.Risk  9.6    7.1                                3X Avg.Risk 23.4   11.0   VITAMIN D 25 Hydroxy (Vit-D Deficiency, Fractures)     Status: None   Collection Time: 05/01/23 12:08 PM  Result Value Ref Range   Vit D,  25-Hydroxy 70.8 30.0 - 100.0 ng/mL    Comment: Vitamin D deficiency has been defined by the Institute of Medicine and an Endocrine Society practice guideline as a level of serum 25-OH vitamin D less than 20 ng/mL (1,2). The Endocrine Society went on to further define vitamin D insufficiency as a level between 21 and 29 ng/mL (2). 1. IOM (Institute of Medicine). 2010. Dietary reference    intakes for calcium and D. [place]: The    [employer]. 2. Holick MF, Binkley [place], Bischoff-Ferrari HA, et al.    Evaluation, treatment, and prevention of vitamin D    deficiency: an Endocrine Society clinical practice    guideline. JCEM. 2011 Jul; 96(7):1911-30.     Assessment/ Plan: Emily Doyle here for annual physical exam.   Annual physical exam  HTN (hypertension), benign - Plan: losartan (COZAAR) 50 MG tablet  Low grade myelodysplastic syndrome (HCC)  Ductal carcinoma in situ (DCIS) of left breast  Pure hypercholesterolemia - Plan: pravastatin (PRAVACHOL) 40 MG tablet  Screen for colon cancer - Plan: Cologuard  Hearing difficulty of both ears  Tetanus shot administered  Blood pressure is well-controlled.  No changes needed.  Losartan renewed.  We reviewed her labs which looked pretty good  Continue follow-up with hematology oncology for low-grade myelodysplastic syndrome and DCIS.  She remains on anastrozole  Fasting lipid was stable.  Pravachol renewed.  Due for Cologuard testing in October.  This has been ordered.  No red flags and no contraindications to utilization of this method  She is going to potentially seek hearing aids.  Declined referral to audiology  Counseled on healthy lifestyle choices, including diet (rich in fruits, vegetables and lean meats and low in salt and simple carbohydrates) and exercise (at least 30 minutes of  moderate physical activity daily).  Patient to follow up 1 year for CPE  Herminia Warren M. Nadine Counts, DO

## 2023-05-23 DIAGNOSIS — Z7961 Long term (current) use of immunomodulator: Secondary | ICD-10-CM | POA: Diagnosis not present

## 2023-05-23 DIAGNOSIS — Z5181 Encounter for therapeutic drug level monitoring: Secondary | ICD-10-CM | POA: Diagnosis not present

## 2023-05-23 DIAGNOSIS — D469 Myelodysplastic syndrome, unspecified: Secondary | ICD-10-CM | POA: Diagnosis not present

## 2023-06-06 DIAGNOSIS — H25812 Combined forms of age-related cataract, left eye: Secondary | ICD-10-CM | POA: Diagnosis not present

## 2023-06-06 DIAGNOSIS — H2512 Age-related nuclear cataract, left eye: Secondary | ICD-10-CM | POA: Diagnosis not present

## 2023-06-27 DIAGNOSIS — D469 Myelodysplastic syndrome, unspecified: Secondary | ICD-10-CM | POA: Diagnosis not present

## 2023-06-27 DIAGNOSIS — Z5181 Encounter for therapeutic drug level monitoring: Secondary | ICD-10-CM | POA: Diagnosis not present

## 2023-06-27 DIAGNOSIS — Z7961 Long term (current) use of immunomodulator: Secondary | ICD-10-CM | POA: Diagnosis not present

## 2023-07-18 ENCOUNTER — Encounter: Payer: Self-pay | Admitting: Family Medicine

## 2023-07-25 DIAGNOSIS — Z5181 Encounter for therapeutic drug level monitoring: Secondary | ICD-10-CM | POA: Diagnosis not present

## 2023-07-25 DIAGNOSIS — D469 Myelodysplastic syndrome, unspecified: Secondary | ICD-10-CM | POA: Diagnosis not present

## 2023-07-25 DIAGNOSIS — Z7961 Long term (current) use of immunomodulator: Secondary | ICD-10-CM | POA: Diagnosis not present

## 2023-07-31 DIAGNOSIS — H5203 Hypermetropia, bilateral: Secondary | ICD-10-CM | POA: Diagnosis not present

## 2023-07-31 DIAGNOSIS — H52222 Regular astigmatism, left eye: Secondary | ICD-10-CM | POA: Diagnosis not present

## 2023-08-22 DIAGNOSIS — D469 Myelodysplastic syndrome, unspecified: Secondary | ICD-10-CM | POA: Diagnosis not present

## 2023-08-22 DIAGNOSIS — Z5181 Encounter for therapeutic drug level monitoring: Secondary | ICD-10-CM | POA: Diagnosis not present

## 2023-08-22 DIAGNOSIS — Z7961 Long term (current) use of immunomodulator: Secondary | ICD-10-CM | POA: Diagnosis not present

## 2023-09-19 DIAGNOSIS — D469 Myelodysplastic syndrome, unspecified: Secondary | ICD-10-CM | POA: Diagnosis not present

## 2023-10-17 DIAGNOSIS — D469 Myelodysplastic syndrome, unspecified: Secondary | ICD-10-CM | POA: Diagnosis not present

## 2023-10-17 DIAGNOSIS — Z7961 Long term (current) use of immunomodulator: Secondary | ICD-10-CM | POA: Diagnosis not present

## 2023-10-17 DIAGNOSIS — Z5181 Encounter for therapeutic drug level monitoring: Secondary | ICD-10-CM | POA: Diagnosis not present

## 2023-11-13 DIAGNOSIS — D469 Myelodysplastic syndrome, unspecified: Secondary | ICD-10-CM | POA: Diagnosis not present

## 2023-11-13 DIAGNOSIS — Z7961 Long term (current) use of immunomodulator: Secondary | ICD-10-CM | POA: Diagnosis not present

## 2023-11-13 DIAGNOSIS — Z5181 Encounter for therapeutic drug level monitoring: Secondary | ICD-10-CM | POA: Diagnosis not present

## 2023-12-11 DIAGNOSIS — D469 Myelodysplastic syndrome, unspecified: Secondary | ICD-10-CM | POA: Diagnosis not present

## 2024-01-09 DIAGNOSIS — D469 Myelodysplastic syndrome, unspecified: Secondary | ICD-10-CM | POA: Diagnosis not present

## 2024-01-09 DIAGNOSIS — Z5181 Encounter for therapeutic drug level monitoring: Secondary | ICD-10-CM | POA: Diagnosis not present

## 2024-01-09 DIAGNOSIS — Z7961 Long term (current) use of immunomodulator: Secondary | ICD-10-CM | POA: Diagnosis not present

## 2024-01-17 DIAGNOSIS — D0512 Intraductal carcinoma in situ of left breast: Secondary | ICD-10-CM | POA: Diagnosis not present

## 2024-01-17 DIAGNOSIS — H524 Presbyopia: Secondary | ICD-10-CM | POA: Diagnosis not present

## 2024-01-17 DIAGNOSIS — Z961 Presence of intraocular lens: Secondary | ICD-10-CM | POA: Diagnosis not present

## 2024-01-17 DIAGNOSIS — H04123 Dry eye syndrome of bilateral lacrimal glands: Secondary | ICD-10-CM | POA: Diagnosis not present

## 2024-01-21 DIAGNOSIS — D0512 Intraductal carcinoma in situ of left breast: Secondary | ICD-10-CM | POA: Diagnosis not present

## 2024-02-05 DIAGNOSIS — Z7961 Long term (current) use of immunomodulator: Secondary | ICD-10-CM | POA: Diagnosis not present

## 2024-02-05 DIAGNOSIS — D469 Myelodysplastic syndrome, unspecified: Secondary | ICD-10-CM | POA: Diagnosis not present

## 2024-02-05 DIAGNOSIS — Z5181 Encounter for therapeutic drug level monitoring: Secondary | ICD-10-CM | POA: Diagnosis not present

## 2024-02-11 DIAGNOSIS — Z1211 Encounter for screening for malignant neoplasm of colon: Secondary | ICD-10-CM | POA: Diagnosis not present

## 2024-02-16 LAB — COLOGUARD: COLOGUARD: NEGATIVE

## 2024-02-18 ENCOUNTER — Ambulatory Visit: Payer: Self-pay | Admitting: Family Medicine

## 2024-03-06 DIAGNOSIS — D469 Myelodysplastic syndrome, unspecified: Secondary | ICD-10-CM | POA: Diagnosis not present

## 2024-03-06 DIAGNOSIS — Z5181 Encounter for therapeutic drug level monitoring: Secondary | ICD-10-CM | POA: Diagnosis not present

## 2024-03-06 DIAGNOSIS — Z7961 Long term (current) use of immunomodulator: Secondary | ICD-10-CM | POA: Diagnosis not present

## 2024-04-03 DIAGNOSIS — Z7961 Long term (current) use of immunomodulator: Secondary | ICD-10-CM | POA: Diagnosis not present

## 2024-04-03 DIAGNOSIS — D469 Myelodysplastic syndrome, unspecified: Secondary | ICD-10-CM | POA: Diagnosis not present

## 2024-04-03 DIAGNOSIS — Z5181 Encounter for therapeutic drug level monitoring: Secondary | ICD-10-CM | POA: Diagnosis not present
